# Patient Record
Sex: Male | Born: 2000 | Hispanic: No | Marital: Single | Smoking: Current every day smoker
Health system: Southern US, Community
[De-identification: ages and names within clinical notes are randomized; demographics above are authoritative.]

## PROBLEM LIST (undated history)

## (undated) DIAGNOSIS — Z720 Tobacco use: Secondary | ICD-10-CM

---
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
## 2021-05-25 ENCOUNTER — Encounter (HOSPITAL_COMMUNITY): Payer: Self-pay | Admitting: Emergency Medicine

## 2021-05-25 ENCOUNTER — Other Ambulatory Visit: Payer: Self-pay

## 2021-05-25 ENCOUNTER — Ambulatory Visit (HOSPITAL_COMMUNITY)
Admission: EM | Admit: 2021-05-25 | Discharge: 2021-05-25 | Disposition: A | Discharge status: Home / Self Care | Payer: Medicaid Other | Attending: Emergency Medicine | Admitting: Emergency Medicine

## 2021-05-25 DIAGNOSIS — H1031 Unspecified acute conjunctivitis, right eye: Secondary | ICD-10-CM | POA: Diagnosis not present

## 2021-05-25 MED ORDER — ERYTHROMYCIN 5 MG/GM OP OINT: TOPICAL_OINTMENT | OPHTHALMIC | 0 refills | Status: DC

## 2021-05-25 NOTE — Discharge Instructions (Addendum)
Apply the erythromycin ointment 1/2 inch to the lower eyelid of right eye 4 times a day for the next 5-7 days.  If your symptoms have completely resolved after 5 days you can stop the antibiotics, otherwise continue through 7 days.   Wash pillow cases, wash hands regularly with soap and water, avoid touching your face and eyes, wash door handles, light switches, remotes and other objects you frequently touch Return or follow up with PCP if symptoms persists such as fever, chills, redness, swelling, eye pain, painful eye movements, vision changes.

## 2021-05-25 NOTE — ED Triage Notes (Signed)
Pt c/o right eye pinkish and drainage and was crusted over this morning.

## 2021-05-25 NOTE — ED Provider Notes (Signed)
[facility]    CSN: [id number] Arrival date & time: 05/25/21  0946      History   Chief Complaint Chief Complaint  Patient presents with   Conjunctivitis    HPI [facility] is a 20 y.o. male.   Patient here for evaluation of right eye redness and drainage that started this morning.  Reports right eye was crusted shut this morning.  Denies getting anything into his eyes or feeling like there is something in his eye.  Denies any blurry vision, vision changes, or photophobia.  Patient does not wear contacts or glasses.  Has not tried any OTC medications or treatments.  Denies any trauma, injury, or other precipitating event.  Denies any specific alleviating or aggravating factors.  Denies any fevers, chest pain, shortness of breath, N/V/D, numbness, tingling, weakness, abdominal pain, or headaches.     The history is provided by the patient.  Conjunctivitis   History reviewed. No pertinent past medical history.  There are no problems to display for this patient.   History reviewed. No pertinent surgical history.     Home Medications    Prior to Admission medications   Medication Sig Start Date End Date Taking? Authorizing Provider  erythromycin ophthalmic ointment Place a 1/2 inch ribbon of ointment into the lower eyelid. 05/25/21  Yes Ivette Loyal, NP  cetirizine (ZYRTEC) 10 MG chewable tablet Chew 1 tablet (10 mg total) by mouth daily. 01/08/12 01/07/13  Moreno-Coll, Adlih, MD  diclofenac (CATAFLAM) 50 MG tablet Take 1 tablet (50 mg total) by mouth 2 (two) times daily. For chest pains 12/25/15   Linna Hoff, MD  ibuprofen (ADVIL) 600 MG tablet Take 1 tablet (600 mg total) by mouth every 6 (six) hours as needed. 06/26/20   Dartha Lodge, PA-C  methocarbamol (ROBAXIN) 500 MG tablet Take 1 tablet (500 mg total) by mouth 2 (two) times daily. 06/26/20   Dartha Lodge, PA-C  mupirocin cream (BACTROBAN) 2 % Apply 1 application topically 2 (two) times  daily. 03/27/19   Mickie Bail, NP    Family History Family History  Problem Relation Age of Onset   Healthy Mother    Healthy Father     Social History Social History   Tobacco Use   Smoking status: Every Day    Types: E-cigarettes   Smokeless tobacco: Never  Vaping Use   Vaping Use: Some days   Substances: Nicotine, Flavoring  Substance Use Topics   Alcohol use: No   Drug use: No     Allergies   Patient has no known allergies.   Review of Systems Review of Systems  Eyes:  Positive for discharge and redness. Negative for photophobia and visual disturbance.  All other systems reviewed and are negative.   Physical Exam Triage Vital Signs ED Triage Vitals  Enc Vitals Group     BP 05/25/21 1114 108/73     Pulse Rate 05/25/21 1114 77     Resp 05/25/21 1114 16     Temp 05/25/21 1114 98.9 F (37.2 C)     Temp Source 05/25/21 1114 Oral     SpO2 05/25/21 1114 97 %     Weight --      Height --      Head Circumference --      Peak Flow --      Pain Score 05/25/21 1113 0     Pain Loc --      Pain Edu? --  Excl. in GC? --    No data found.  Updated Vital Signs BP 108/73 (BP Location: Right Arm)   Pulse 77   Temp 98.9 F (37.2 C) (Oral)   Resp 16   SpO2 97%   Visual Acuity Right Eye Distance:   Left Eye Distance:   Bilateral Distance:    Right Eye Near:   Left Eye Near:    Bilateral Near:     Physical Exam Vitals and nursing note reviewed.  Constitutional:      General: He is not in acute distress.    Appearance: Normal appearance. He is not ill-appearing, toxic-appearing or diaphoretic.  HENT:     Head: Normocephalic and atraumatic.  Eyes:     General: Lids are normal. Lids are everted, no foreign bodies appreciated. Vision grossly intact. Gaze aligned appropriately.        Right eye: Discharge present.     Conjunctiva/sclera:     Right eye: Right conjunctiva is injected.     Pupils: Pupils are equal, round, and reactive to light.   Cardiovascular:     Rate and Rhythm: Normal rate.     Pulses: Normal pulses.  Pulmonary:     Effort: Pulmonary effort is normal.  Abdominal:     General: Abdomen is flat.  Musculoskeletal:        General: Normal range of motion.     Cervical back: Normal range of motion.  Skin:    General: Skin is warm and dry.  Neurological:     General: No focal deficit present.     Mental Status: He is alert and oriented to person, place, and time.  Psychiatric:        Mood and Affect: Mood normal.     UC Treatments / Results  Labs (all labs ordered are listed, but only abnormal results are displayed) Labs Reviewed - No data to display  EKG   Radiology No results found.  Procedures Procedures (including critical care time)  Medications Ordered in UC Medications - No data to display  Initial Impression / Assessment and Plan / UC Course  I have reviewed the triage vital signs and the nursing notes.  Pertinent labs & imaging results that were available during my care of the patient were reviewed by me and considered in my medical decision making (see chart for details).    Assessment negative for red flags or concerns.  Likely bacterial conjunctivitis of the right eye Erythromycin ointment as prescribed Wash pillow cases, wash hands regularly with soap and water, avoid touching your face and eyes, wash door handles, light switches, remotes and other objects you frequently touch Return or follow up with PCP if symptoms persists such as fever, chills, redness, swelling, eye pain, painful eye movements, vision changes, etc...  Reviewed expectations re: course of current medical issues. Questions answered. Outlined signs and symptoms indicating need for more acute intervention. Patient verbalized understanding. After Visit Summary given. Final Clinical Impressions(s) / UC Diagnoses   Final diagnoses:  Acute bacterial conjunctivitis of right eye     Discharge Instructions       Apply the erythromycin ointment 1/2 inch to the lower eyelid of right eye 4 times a day for the next 5-7 days.  If your symptoms have completely resolved after 5 days you can stop the antibiotics, otherwise continue through 7 days.   Wash pillow cases, wash hands regularly with soap and water, avoid touching your face and eyes, wash door handles, light switches, remotes  and other objects you frequently touch Return or follow up with PCP if symptoms persists such as fever, chills, redness, swelling, eye pain, painful eye movements, vision changes.      ED Prescriptions     Medication Sig Dispense Auth. Provider   erythromycin ophthalmic ointment Place a 1/2 inch ribbon of ointment into the lower eyelid. 3.5 g Ivette Loyal, NP      PDMP not reviewed this encounter.   Ivette Loyal, NP 05/25/21 [phone]
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
## 2022-06-13 ENCOUNTER — Encounter (HOSPITAL_COMMUNITY): Payer: Self-pay

## 2022-06-13 ENCOUNTER — Ambulatory Visit (HOSPITAL_COMMUNITY)
Admission: EM | Admit: 2022-06-13 | Discharge: 2022-06-13 | Disposition: A | Discharge status: Home / Self Care | Payer: Medicaid Other | Attending: Emergency Medicine | Admitting: Emergency Medicine

## 2022-06-13 ENCOUNTER — Ambulatory Visit (INDEPENDENT_AMBULATORY_CARE_PROVIDER_SITE_OTHER): Payer: Medicaid Other

## 2022-06-13 DIAGNOSIS — M25571 Pain in right ankle and joints of right foot: Secondary | ICD-10-CM

## 2022-06-13 DIAGNOSIS — S93411A Sprain of calcaneofibular ligament of right ankle, initial encounter: Secondary | ICD-10-CM | POA: Diagnosis not present

## 2022-06-13 DIAGNOSIS — M79671 Pain in right foot: Secondary | ICD-10-CM | POA: Diagnosis not present

## 2022-06-13 MED ORDER — IBUPROFEN 800 MG PO TABS
800.0000 mg | ORAL_TABLET | Freq: Once | ORAL | Status: AC
  Administered 2022-06-13: 800 mg via ORAL

## 2022-06-13 MED ORDER — IBUPROFEN 800 MG PO TABS
ORAL_TABLET | ORAL | Status: AC
  Filled 2022-06-13: qty 1

## 2022-06-13 MED ORDER — IBUPROFEN 800 MG PO TABS: 800.0000 mg | ORAL_TABLET | Freq: Three times a day (TID) | ORAL | 0 refills | Status: DC | PRN

## 2022-06-13 NOTE — Discharge Instructions (Addendum)
Please keep your ankle wrapped at all times for the next 2 weeks.  Please also use your crutches whenever you need to do any walking so that you do not walk on your right foot at all.  When you are seated, make sure your foot is always elevated.  Whenever you are able, please apply an ice pack to your ankle, you do not need to remove your Ace wrap to do this.  In 7 to 10 days, if you feel like your swelling has not improved or your pain is getting worse, please consider going to see an orthopedic specialist for further evaluation.  You are welcome to take ibuprofen 800 mg every 6-8 hours as needed for pain, I have sent a prescription to your pharmacy.  Thank you for visiting urgent care today.

## 2022-06-13 NOTE — ED Provider Notes (Signed)
[facility]    CSN: [id number] Arrival date & time: 06/13/22  1810    HISTORY   Chief Complaint  Patient presents with   Ankle Injury   HPI Scott Gardner is a pleasant, 21 y.o. male who presents to urgent care today. Patient states that 2 hours ago, while playing basketball, he landed awkwardly on his right ankle.  Patient states when landing he heard a lot of pops and snaps and had acute onset of pain which radiated up his entire right leg.  Patient states he currently has significant swelling of his ankle and has fractured his right ankle in the past.  EMR reviewed, patient injured his right lower leg in 2019, x-ray of his right tibia and fibula did not reveal any acute fracture.  The history is provided by the patient.   History reviewed. No pertinent past medical history. There are no problems to display for this patient.  History reviewed. No pertinent surgical history.  Home Medications    Prior to Admission medications   Medication Sig Start Date End Date Taking? Authorizing Provider    Family History Family History  Problem Relation Age of Onset   Healthy Mother    Healthy Father    Social History Social History   Tobacco Use   Smoking status: Every Day    Types: E-cigarettes   Smokeless tobacco: Never  Vaping Use   Vaping Use: Some days   Substances: Nicotine, Flavoring  Substance Use Topics   Alcohol use: No   Drug use: No   Allergies   Patient has no known allergies.  Review of Systems Review of Systems Pertinent findings revealed after performing a 14 point review of systems has been noted in the history of present illness.  Physical Exam Triage Vital Signs ED Triage Vitals  Enc Vitals Group     BP 05/25/21 0827 (!) 147/82     Pulse Rate 05/25/21 0827 72     Resp 05/25/21 0827 18     Temp 05/25/21 0827 98.3 F (36.8 C)     Temp Source 05/25/21 0827 Oral     SpO2 05/25/21 0827 98 %     Weight --      Height --       Head Circumference --      Peak Flow --      Pain Score 05/25/21 0826 5     Pain Loc --      Pain Edu? --      Excl. in GC? --    Updated Vital Signs BP 121/77 (BP Location: Right Arm)   Pulse 93   Temp 98.2 F (36.8 C) (Oral)   Resp 16   SpO2 96%   Physical Exam Vitals and nursing note reviewed.  Constitutional:      General: He is not in acute distress.    Appearance: Normal appearance. He is normal weight. He is not ill-appearing.  HENT:     Head: Normocephalic and atraumatic.  Eyes:     Extraocular Movements: Extraocular movements intact.     Conjunctiva/sclera: Conjunctivae normal.     Pupils: Pupils are equal, round, and reactive to light.  Cardiovascular:     Rate and Rhythm: Normal rate and regular rhythm.  Pulmonary:     Effort: Pulmonary effort is normal.     Breath sounds: Normal breath sounds.  Musculoskeletal:     Cervical back: Normal range of motion and neck supple.     Right ankle:  Swelling present. No deformity, ecchymosis or lacerations. Tenderness present over the lateral malleolus and CF ligament. Decreased range of motion. Anterior drawer test positive. Normal pulse.     Right Achilles Tendon: Normal.     Left ankle: Normal.  Skin:    General: Skin is warm and dry.  Neurological:     General: No focal deficit present.     Mental Status: He is alert and oriented to person, place, and time. Mental status is at baseline.  Psychiatric:        Mood and Affect: Mood normal.        Behavior: Behavior normal.        Thought Content: Thought content normal.        Judgment: Judgment normal.     UC Couse / Diagnostics / Procedures:     Radiology DG Foot Complete Right  Result Date: 06/13/2022 CLINICAL DATA:  Basketball injury.  Pain, swelling EXAM: RIGHT FOOT COMPLETE - 3+ VIEW COMPARISON:  None Available. FINDINGS: There is no evidence of fracture or dislocation. There is no evidence of arthropathy or other focal bone abnormality. Soft tissues are  unremarkable. IMPRESSION: Negative. Electronically Signed   By: Charlett Nose M.D.   On: 06/13/2022 20:18    Procedures Procedures (including critical care time) EKG  Pending results:  Labs Reviewed - No data to display  Medications Ordered in UC: Medications  ibuprofen (ADVIL) tablet 800 mg (800 mg Oral Given 06/13/22 2014)    UC Diagnoses / Final Clinical Impressions(s)   I have reviewed the triage vital signs and the nursing notes.  Pertinent labs & imaging results that were available during my care of the patient were reviewed by me and considered in my medical decision making (see chart for details).    Final diagnoses:  Acute right ankle pain  Sprain of calcaneofibular ligament of right ankle, initial encounter   Patient advised of x-ray findings.  Patient placed in an Ace wrap and provided with crutches to avoid weightbearing for the next 2 weeks.  Patient advised that it can take 6 weeks for the sprain to completely heal so patient is to avoid strenuous activities and limit weightbearing only to walking as needed after 2 weeks of nonweightbearing.  Patient advised to take ibuprofen 400 mg every 6-8 hours as needed for pain.  Patient advised to keep foot elevated at all times for the next week and a half.  Patient advised to ice ankle is much as possible.  Patient advised to follow-up with orthopedics if no better in 7 to 10 days.   ED Prescriptions     Medication Sig Dispense Auth. Provider   ibuprofen (ADVIL) 800 MG tablet Take 1 tablet (800 mg total) by mouth every 8 (eight) hours as needed for up to 21 doses for fever, headache, mild pain or moderate pain. 21 tablet Theadora Rama Scales, PA-C      PDMP not reviewed this encounter.  Discharge Instructions:   Discharge Instructions      Please keep your ankle wrapped at all times for the next 2 weeks.  Please also use your crutches whenever you need to do any walking so that you do not walk on your right foot at  all.  When you are seated, make sure your foot is always elevated.  Whenever you are able, please apply an ice pack to your ankle, you do not need to remove your Ace wrap to do this.  In 7 to 10 days, if  you feel like your swelling has not improved or your pain is getting worse, please consider going to see an orthopedic specialist for further evaluation.  You are welcome to take ibuprofen 800 mg every 6-8 hours as needed for pain, I have sent a prescription to your pharmacy.  Thank you for visiting urgent care today.      Disposition Upon Discharge:  Condition: stable for discharge home Home: take medications as prescribed; routine discharge instructions as discussed; follow up as advised.  Patient presented with an acute illness with associated systemic symptoms and significant discomfort requiring urgent management. In my opinion, this is a condition that a prudent lay person (someone who possesses an average knowledge of health and medicine) may potentially expect to result in complications if not addressed urgently such as respiratory distress, impairment of bodily function or dysfunction of bodily organs.   Routine symptom specific, illness specific and/or disease specific instructions were discussed with the patient and/or caregiver at length.   As such, the patient has been evaluated and assessed, work-up was performed and treatment was provided in alignment with urgent care protocols and evidence based medicine.  Patient/parent/caregiver has been advised that the patient may require follow up for further testing and treatment if the symptoms continue in spite of treatment, as clinically indicated and appropriate.  Patient/parent/caregiver has been advised to report to orthopedic urgent care clinic or return to the [facility] or PCP in 3-5 days if no better; follow-up with orthopedics, PCP or the Emergency Department if new signs and symptoms develop or if the current signs or symptoms  continue to change or worsen for further workup, evaluation and treatment as clinically indicated and appropriate  The patient will follow up with their current PCP if and as advised. If the patient does not currently have a PCP we will have assisted them in obtaining one.   The patient may need specialty follow up if the symptoms continue, in spite of conservative treatment and management, for further workup, evaluation, consultation and treatment as clinically indicated and appropriate.  Patient/parent/caregiver verbalized understanding and agreement of plan as discussed.  All questions were addressed during visit.  Please see discharge instructions below for further details of plan.  This office note has been dictated using Teaching laboratory technician.  Unfortunately, this method of dictation can sometimes lead to typographical or grammatical errors.  I apologize for your inconvenience in advance if this occurs.  Please do not hesitate to reach out to me if clarification is needed.      Theadora Rama Scales, PA-C 06/13/22 2033

## 2022-06-13 NOTE — ED Triage Notes (Signed)
Chief Complaint: Landed on the right ankle while playing basketball 2 hours ago. Heard a lot of pops and snaps. Pain now shooting up the entire leg. Swelling in the ankle as well. H/o of fractures to this ankle.

## 2024-02-23 ENCOUNTER — Ambulatory Visit
Admission: RE | Admit: 2024-02-23 | Discharge: 2024-02-23 | Disposition: A | Discharge status: Home / Self Care | Source: Ambulatory Visit | Attending: Family Medicine | Admitting: Family Medicine

## 2024-02-23 ENCOUNTER — Ambulatory Visit (INDEPENDENT_AMBULATORY_CARE_PROVIDER_SITE_OTHER): Admitting: Radiology

## 2024-02-23 VITALS — BP 123/81 | HR 82 | Temp 97.9°F | Resp 16

## 2024-02-23 DIAGNOSIS — M25561 Pain in right knee: Secondary | ICD-10-CM

## 2024-02-23 DIAGNOSIS — S83401A Sprain of unspecified collateral ligament of right knee, initial encounter: Secondary | ICD-10-CM | POA: Diagnosis not present

## 2024-02-23 DIAGNOSIS — M25461 Effusion, right knee: Secondary | ICD-10-CM

## 2024-02-23 DIAGNOSIS — S8391XA Sprain of unspecified site of right knee, initial encounter: Secondary | ICD-10-CM | POA: Diagnosis not present

## 2024-02-23 MED ORDER — IBUPROFEN 800 MG PO TABS: 800.0000 mg | ORAL_TABLET | Freq: Three times a day (TID) | ORAL | 0 refills | Status: DC | PRN

## 2024-02-23 MED ORDER — IBUPROFEN 800 MG PO TABS
800.0000 mg | ORAL_TABLET | Freq: Once | ORAL | Status: AC
  Administered 2024-02-23: 800 mg via ORAL

## 2024-02-23 NOTE — Discharge Instructions (Addendum)
 You were evaluated today for a knee injury sustained during a football game, with worsening symptoms after a pool incident the following day. X-rays showed no acute fracture or dislocation, but there is joint effusion and soft tissue swelling along the anterior and lateral aspects of the knee. Motrin  800 mg was given in clinic and prescribed to take three times daily as needed for pain and inflammation. A knee sleeve was applied for support. To manage symptoms at home, follow the RICE method: rest the knee, apply ice for 15-20 minutes every few hours, use compression with the sleeve, and elevate the leg to reduce swelling. Avoid high-impact activities or bearing weight on the injured leg until symptoms improve. Monitor for worsening pain, increased swelling, numbness, inability to move the knee, or signs of infection such as warmth, redness, or fever. Follow up with orthopedics. Call to make appointment. Seek emergency care if you experience sudden severe pain, joint instability, or cannot bear weight on the leg.

## 2024-02-23 NOTE — ED Triage Notes (Signed)
 Pt c/o right knee pain after he did a cannon ball into a pool over the weekend and hit bottom of pool with knee. States he was drinking and didn't realize he jumped into 48ft end of pool.  Last took ibuprofen  last night. He is able to bend knee but has difficulty straightening knee.

## 2024-02-23 NOTE — ED Provider Notes (Signed)
 [facility]    CSN: [id number] Arrival date & time: 02/23/24  1533      History   Chief Complaint Chief Complaint  Patient presents with   Knee Injury    Entered by patient    HPI Scott Gardner is a 23 y.o. male.   Discussed the use of AI scribe software for clinical note transcription with the patient, who gave verbal consent to proceed.   Patient presents with right knee pain and swelling following two recent injuries. Patient reports a history of previous knee injury around 2020, including a partially torn ligament and hairline fracture.  The patient states he initially injured his knee on Saturday while playing football with friends when he slipped and pulled his knee. The following day, he re-injured the same knee while at a pool. He reports jumping into what he thought was the 6-foot side but was actually the 3-foot side, exacerbating the injury. The patient describes the pain as severe, rating it a 10 out of 10 on a pain scale. He reports experiencing a little numbness in the affected area. He applied ice and heat yesterday but have not attempted any treatments today.   The following portions of the patient's history were reviewed and updated as appropriate: allergies, current medications, past family history, past medical history, past social history, past surgical history, and problem list.     History reviewed. No pertinent past medical history.  There are no active problems to display for this patient.   History reviewed. No pertinent surgical history.     Home Medications    Prior to Admission medications   Medication Sig Start Date End Date Taking? Authorizing Provider  ibuprofen  (ADVIL ) 800 MG tablet Take 1 tablet (800 mg total) by mouth every 8 (eight) hours as needed (pain). Take with food to avoid stomach upset. Do not take any additional NSAIDs while on this. You may take tylenol  in addition to this if needed for extra pain relief.  02/23/24  Yes Iola Lukes, FNP    Family History Family History  Problem Relation Age of Onset   Healthy Mother    Healthy Father     Social History Social History   Tobacco Use   Smoking status: Every Day    Types: E-cigarettes   Smokeless tobacco: Never  Vaping Use   Vaping status: Some Days   Substances: Nicotine, Flavoring  Substance Use Topics   Alcohol use: No   Drug use: No     Allergies   Patient has no known allergies.   Review of Systems Review of Systems  Musculoskeletal:  Positive for joint swelling. Negative for gait problem.  Neurological:  Positive for numbness. Negative for weakness.  All other systems reviewed and are negative.    Physical Exam Triage Vital Signs ED Triage Vitals  Encounter Vitals Group     BP 02/23/24 1539 123/81     Girls Systolic BP Percentile --      Girls Diastolic BP Percentile --      Boys Systolic BP Percentile --      Boys Diastolic BP Percentile --      Pulse Rate 02/23/24 1539 82     Resp 02/23/24 1539 16     Temp 02/23/24 1539 97.9 F (36.6 C)     Temp Source 02/23/24 1539 Oral     SpO2 02/23/24 1539 95 %     Weight --      Height --  Head Circumference --      Peak Flow --      Pain Score 02/23/24 1542 10     Pain Loc --      Pain Education --      Exclude from Growth Chart --    No data found.  Updated Vital Signs BP 123/81 (BP Location: Right Arm)   Pulse 82   Temp 97.9 F (36.6 C) (Oral)   Resp 16   SpO2 95%   Visual Acuity Right Eye Distance:   Left Eye Distance:   Bilateral Distance:    Right Eye Near:   Left Eye Near:    Bilateral Near:     Physical Exam Vitals reviewed.  Constitutional:      General: He is awake. He is not in acute distress.    Appearance: Normal appearance. He is well-developed. He is not ill-appearing, toxic-appearing or diaphoretic.  HENT:     Head: Normocephalic.     Right Ear: Hearing normal.     Left Ear: Hearing normal.     Nose: Nose normal.      Mouth/Throat:     Mouth: Mucous membranes are moist.  Eyes:     General: Vision grossly intact.     Conjunctiva/sclera: Conjunctivae normal.  Cardiovascular:     Rate and Rhythm: Normal rate and regular rhythm.     Heart sounds: Normal heart sounds.  Pulmonary:     Effort: Pulmonary effort is normal.     Breath sounds: Normal breath sounds and air entry.  Musculoskeletal:        General: Normal range of motion.     Cervical back: Full passive range of motion without pain, normal range of motion and neck supple.     Right knee: Swelling present. No deformity, effusion, erythema, ecchymosis, lacerations or crepitus. Normal range of motion. Tenderness present over the LCL and ACL. Normal alignment, normal meniscus and normal patellar mobility.  Skin:    General: Skin is warm and dry.  Neurological:     General: No focal deficit present.     Mental Status: He is alert and oriented to person, place, and time.  Psychiatric:        Speech: Speech normal.        Behavior: Behavior is cooperative.      UC Treatments / Results  Labs (all labs ordered are listed, but only abnormal results are displayed) Labs Reviewed - No data to display  EKG   Radiology DG Knee Complete 4 Views Right Result Date: 02/23/2024 CLINICAL DATA:  Injury skip EXAM: RIGHT KNEE - COMPLETE 4+ VIEW COMPARISON:  None Available. FINDINGS: No acute fracture or dislocation identified. Joint effusion present. No evidence of arthropathy or other focal bone abnormality. There is anterior and lateral knee soft tissue swelling. There is also soft tissue thickening in the region of the patellar tendon. IMPRESSION: 1. No acute fracture or dislocation. 2. Joint effusion. 3. Anterior and lateral knee soft tissue swelling. Electronically Signed   By: Greig Pique M.D.   On: 02/23/2024 16:34    Procedures Procedures (including critical care time)  Medications Ordered in UC Medications  ibuprofen  (ADVIL ) tablet 800 mg  (800 mg Oral Given 02/23/24 1607)    Initial Impression / Assessment and Plan / UC Course  I have reviewed the triage vital signs and the nursing notes.  Pertinent labs & imaging results that were available during my care of the patient were reviewed by me and considered in  my medical decision making (see chart for details).    Patient presents with acute knee pain following an injury sustained during a football game on Saturday, which was worsened by a subsequent pool incident. Reports pain rated 10/10 with some associated numbness. History includes a prior knee injury in 2020 involving a partial ligament tear and hairline fracture. On examination, the knee is swollen but without deformity or obvious effusion. X-ray showed no acute fracture or dislocation but there is a joint effusion in the soft tissues along the anterior and lateral aspects of the right knee.Motrin  800 mg was administered in clinic and prescribed to continue three times daily as needed. A knee sleeve was applied, and the patient was advised to follow RICE (rest, ice, compression, elevation) protocol. Patient will be contacted if any abnormalities are noted on imaging; otherwise, results may be viewed on MyChart. Orthopedic follow-up is recommended if symptoms do not improve within the next few days or if condition worsens.  Today's evaluation has revealed no signs of a dangerous process. Discussed diagnosis with patient and/or guardian. Patient and/or guardian aware of their diagnosis, possible red flag symptoms to watch out for and need for close follow up. Patient and/or guardian understands verbal and written discharge instructions. Patient and/or guardian comfortable with plan and disposition.  Patient and/or guardian has a clear mental status at this time, good insight into illness (after discussion and teaching) and has clear judgment to make decisions regarding their care  Documentation was completed with the aid of voice  recognition software. Transcription may contain typographical errors. Final Clinical Impressions(s) / UC Diagnoses   Final diagnoses:  Acute pain of right knee  Effusion of right knee joint  Sprain of right knee, unspecified ligament, initial encounter     Discharge Instructions      You were evaluated today for a knee injury sustained during a football game, with worsening symptoms after a pool incident the following day. X-rays showed no acute fracture or dislocation, but there is joint effusion and soft tissue swelling along the anterior and lateral aspects of the knee. Motrin  800 mg was given in clinic and prescribed to take three times daily as needed for pain and inflammation. A knee sleeve was applied for support. To manage symptoms at home, follow the RICE method: rest the knee, apply ice for 15-20 minutes every few hours, use compression with the sleeve, and elevate the leg to reduce swelling. Avoid high-impact activities or bearing weight on the injured leg until symptoms improve. Monitor for worsening pain, increased swelling, numbness, inability to move the knee, or signs of infection such as warmth, redness, or fever. Follow up with orthopedics. Call to make appointment. Seek emergency care if you experience sudden severe pain, joint instability, or cannot bear weight on the leg.      ED Prescriptions     Medication Sig Dispense Auth. Provider   ibuprofen  (ADVIL ) 800 MG tablet Take 1 tablet (800 mg total) by mouth every 8 (eight) hours as needed (pain). Take with food to avoid stomach upset. Do not take any additional NSAIDs while on this. You may take tylenol  in addition to this if needed for extra pain relief. 21 tablet Iola Lukes, FNP      PDMP not reviewed this encounter.   Iola Lukes, [place] 02/23/24 [phone]

## 2024-02-25 ENCOUNTER — Ambulatory Visit: Payer: Self-pay

## 2024-02-25 ENCOUNTER — Encounter (HOSPITAL_BASED_OUTPATIENT_CLINIC_OR_DEPARTMENT_OTHER): Payer: Self-pay

## 2024-02-25 ENCOUNTER — Other Ambulatory Visit: Payer: Self-pay

## 2024-02-25 ENCOUNTER — Emergency Department (HOSPITAL_BASED_OUTPATIENT_CLINIC_OR_DEPARTMENT_OTHER): Admitting: Radiology

## 2024-02-25 ENCOUNTER — Emergency Department (HOSPITAL_BASED_OUTPATIENT_CLINIC_OR_DEPARTMENT_OTHER)

## 2024-02-25 ENCOUNTER — Inpatient Hospital Stay (HOSPITAL_BASED_OUTPATIENT_CLINIC_OR_DEPARTMENT_OTHER)
Admission: EM | Admit: 2024-02-25 | Discharge: 2024-02-27 | DRG: 287 | Disposition: A | Discharge status: Home / Self Care | Attending: Internal Medicine | Admitting: Internal Medicine

## 2024-02-25 DIAGNOSIS — R072 Precordial pain: Secondary | ICD-10-CM

## 2024-02-25 DIAGNOSIS — F17290 Nicotine dependence, other tobacco product, uncomplicated: Secondary | ICD-10-CM | POA: Diagnosis present

## 2024-02-25 DIAGNOSIS — I214 Non-ST elevation (NSTEMI) myocardial infarction: Secondary | ICD-10-CM | POA: Diagnosis not present

## 2024-02-25 DIAGNOSIS — E669 Obesity, unspecified: Secondary | ICD-10-CM | POA: Diagnosis present

## 2024-02-25 DIAGNOSIS — R0789 Other chest pain: Secondary | ICD-10-CM | POA: Diagnosis not present

## 2024-02-25 DIAGNOSIS — M25561 Pain in right knee: Secondary | ICD-10-CM | POA: Diagnosis not present

## 2024-02-25 DIAGNOSIS — Z79899 Other long term (current) drug therapy: Secondary | ICD-10-CM

## 2024-02-25 DIAGNOSIS — R7989 Other specified abnormal findings of blood chemistry: Principal | ICD-10-CM | POA: Diagnosis present

## 2024-02-25 DIAGNOSIS — I5A Non-ischemic myocardial injury (non-traumatic): Secondary | ICD-10-CM | POA: Diagnosis present

## 2024-02-25 DIAGNOSIS — R079 Chest pain, unspecified: Secondary | ICD-10-CM | POA: Diagnosis present

## 2024-02-25 DIAGNOSIS — F1090 Alcohol use, unspecified, uncomplicated: Secondary | ICD-10-CM | POA: Insufficient documentation

## 2024-02-25 DIAGNOSIS — Z8249 Family history of ischemic heart disease and other diseases of the circulatory system: Secondary | ICD-10-CM

## 2024-02-25 DIAGNOSIS — Z823 Family history of stroke: Secondary | ICD-10-CM

## 2024-02-25 DIAGNOSIS — Z6834 Body mass index (BMI) 34.0-34.9, adult: Secondary | ICD-10-CM

## 2024-02-25 DIAGNOSIS — I409 Acute myocarditis, unspecified: Principal | ICD-10-CM | POA: Diagnosis present

## 2024-02-25 HISTORY — DX: Tobacco use: Z72.0

## 2024-02-25 LAB — BASIC METABOLIC PANEL WITH GFR
Anion gap: 8 (ref 5–15)
BUN: 9 mg/dL (ref 6–20)
CO2: 28 mmol/L (ref 22–32)
Calcium: 9.5 mg/dL (ref 8.9–10.3)
Chloride: 105 mmol/L (ref 98–111)
Creatinine, Ser: 1.13 mg/dL (ref 0.61–1.24)
GFR, Estimated: >60 mL/min (ref >60)
Glucose, Bld: 119 mg/dL — ABNORMAL HIGH (ref 70–99)
Potassium: 4.3 mmol/L (ref 3.5–5.1)
Sodium: 141 mmol/L (ref 135–145)

## 2024-02-25 LAB — HIV ANTIBODY (ROUTINE TESTING W REFLEX): HIV Screen 4th Generation wRfx: NONREACTIVE

## 2024-02-25 LAB — PROTIME-INR
INR: 1 (ref 0.8–1.2)
Prothrombin Time: 14 s (ref 11.4–15.2)

## 2024-02-25 LAB — RAPID URINE DRUG SCREEN, HOSP PERFORMED
Amphetamines: NOT DETECTED
Barbiturates: NOT DETECTED
Benzodiazepines: NOT DETECTED
Cocaine: NOT DETECTED
Opiates: NOT DETECTED
Tetrahydrocannabinol: POSITIVE — AB

## 2024-02-25 LAB — TROPONIN T, HIGH SENSITIVITY
Troponin T High Sensitivity: 263 ng/L (ref <19)
Troponin T High Sensitivity: 385 ng/L (ref <19)

## 2024-02-25 LAB — CBC
HCT: 47.4 % (ref 39.0–52.0)
Hemoglobin: 16 g/dL (ref 13.0–17.0)
MCH: 30.5 pg (ref 26.0–34.0)
MCHC: 33.8 g/dL (ref 30.0–36.0)
MCV: 90.3 fL (ref 80.0–100.0)
Platelets: 175 K/uL (ref 150–400)
RBC: 5.25 MIL/uL (ref 4.22–5.81)
RDW: 12.5 % (ref 11.5–15.5)
WBC: 7.7 K/uL (ref 4.0–10.5)
nRBC: 0 % (ref 0.0–0.2)

## 2024-02-25 LAB — SEDIMENTATION RATE: Sed Rate: 1 mm/h (ref 0–16)

## 2024-02-25 LAB — TROPONIN I (HIGH SENSITIVITY): Troponin I (High Sensitivity): 8509 ng/L (ref <18)

## 2024-02-25 LAB — TSH: TSH: 1.427 u[IU]/mL (ref 0.350–4.500)

## 2024-02-25 LAB — APTT: aPTT: 31 s (ref 24–36)

## 2024-02-25 LAB — ETHANOL: Alcohol, Ethyl (B): <15 mg/dL (ref <15)

## 2024-02-25 LAB — C-REACTIVE PROTEIN: CRP: 1.6 mg/dL — ABNORMAL HIGH (ref <1.0)

## 2024-02-25 MED ORDER — ONDANSETRON HCL 4 MG PO TABS: 4.0000 mg | ORAL_TABLET | Freq: Four times a day (QID) | ORAL | Status: DC | PRN

## 2024-02-25 MED ORDER — HEPARIN (PORCINE) 25000 UT/250ML-% IV SOLN
1450.0000 [IU]/h | INTRAVENOUS | Status: DC
  Administered 2024-02-25: 1200 [IU]/h via INTRAVENOUS

## 2024-02-25 MED ORDER — HEPARIN (PORCINE) 25000 UT/250ML-% IV SOLN
1400.0000 [IU]/h | INTRAVENOUS | Status: DC
  Filled 2024-02-25: qty 250

## 2024-02-25 MED ORDER — IOHEXOL 350 MG/ML SOLN
100.0000 mL | Freq: Once | INTRAVENOUS | Status: AC | PRN
  Administered 2024-02-25: 100 mL via INTRAVENOUS

## 2024-02-25 MED ORDER — FOLIC ACID 1 MG PO TABS
1.0000 mg | ORAL_TABLET | Freq: Every day | ORAL | Status: DC
  Administered 2024-02-25 – 2024-02-27 (×3): 1 mg via ORAL
  Filled 2024-02-25 (×3): qty 1

## 2024-02-25 MED ORDER — ASPIRIN 81 MG PO CHEW
81.0000 mg | CHEWABLE_TABLET | Freq: Every day | ORAL | Status: DC
  Administered 2024-02-25: 81 mg via ORAL
  Filled 2024-02-25: qty 1

## 2024-02-25 MED ORDER — ADULT MULTIVITAMIN W/MINERALS CH
1.0000 | ORAL_TABLET | Freq: Every day | ORAL | Status: DC
  Administered 2024-02-25 – 2024-02-27 (×3): 1 via ORAL
  Filled 2024-02-25 (×3): qty 1

## 2024-02-25 MED ORDER — SODIUM CHLORIDE 0.9 % IV SOLN: 250.0000 mL | INTRAVENOUS | Status: AC | PRN

## 2024-02-25 MED ORDER — ACETAMINOPHEN 650 MG RE SUPP: 650.0000 mg | Freq: Four times a day (QID) | RECTAL | Status: DC | PRN

## 2024-02-25 MED ORDER — ASPIRIN 81 MG PO TBEC
81.0000 mg | DELAYED_RELEASE_TABLET | Freq: Every day | ORAL | Status: DC
  Administered 2024-02-26: 81 mg via ORAL
  Filled 2024-02-25: qty 1

## 2024-02-25 MED ORDER — HEPARIN BOLUS VIA INFUSION
4000.0000 [IU] | Freq: Once | INTRAVENOUS | Status: DC
  Filled 2024-02-25: qty 4000

## 2024-02-25 MED ORDER — HEPARIN SODIUM (PORCINE) 5000 UNIT/ML IJ SOLN: 5000.0000 [IU] | Freq: Three times a day (TID) | INTRAMUSCULAR | Status: DC

## 2024-02-25 MED ORDER — SODIUM CHLORIDE 0.9% FLUSH
3.0000 mL | Freq: Two times a day (BID) | INTRAVENOUS | Status: DC
  Administered 2024-02-25 – 2024-02-27 (×3): 3 mL via INTRAVENOUS

## 2024-02-25 MED ORDER — SODIUM CHLORIDE 0.9% FLUSH
3.0000 mL | Freq: Two times a day (BID) | INTRAVENOUS | Status: DC
  Administered 2024-02-26 – 2024-02-27 (×2): 3 mL via INTRAVENOUS

## 2024-02-25 MED ORDER — ASPIRIN 325 MG PO TABS
325.0000 mg | ORAL_TABLET | Freq: Every day | ORAL | Status: DC
  Filled 2024-02-25: qty 1

## 2024-02-25 MED ORDER — ASPIRIN 81 MG PO CHEW
243.0000 mg | CHEWABLE_TABLET | Freq: Once | ORAL | Status: AC
  Administered 2024-02-25: 243 mg via ORAL
  Filled 2024-02-25: qty 3

## 2024-02-25 MED ORDER — SODIUM CHLORIDE 0.9% FLUSH: 3.0000 mL | INTRAVENOUS | Status: DC | PRN

## 2024-02-25 MED ORDER — HEPARIN BOLUS VIA INFUSION
4000.0000 [IU] | Freq: Once | INTRAVENOUS | Status: AC
  Administered 2024-02-25: 4000 [IU] via INTRAVENOUS
  Filled 2024-02-25: qty 4000

## 2024-02-25 MED ORDER — ONDANSETRON HCL 4 MG/2ML IJ SOLN: 4.0000 mg | Freq: Four times a day (QID) | INTRAMUSCULAR | Status: DC | PRN

## 2024-02-25 MED ORDER — THIAMINE MONONITRATE 100 MG PO TABS
100.0000 mg | ORAL_TABLET | Freq: Every day | ORAL | Status: DC
  Administered 2024-02-25 – 2024-02-27 (×3): 100 mg via ORAL
  Filled 2024-02-25 (×3): qty 1

## 2024-02-25 MED ORDER — ACETAMINOPHEN 325 MG PO TABS: 650.0000 mg | ORAL_TABLET | Freq: Four times a day (QID) | ORAL | Status: DC | PRN

## 2024-02-25 MED ORDER — PANTOPRAZOLE SODIUM 40 MG PO TBEC
40.0000 mg | DELAYED_RELEASE_TABLET | Freq: Every day | ORAL | Status: DC
  Administered 2024-02-25: 40 mg via ORAL
  Filled 2024-02-25: qty 1

## 2024-02-25 NOTE — ED Notes (Signed)
 I have just called report to Suzen, Charity fundraiser at [facility], [place] [address] [facility] has just arrived for tx.

## 2024-02-25 NOTE — H&P (Addendum)
 History and Physical    Scott Gardner FMW:[id number] DOB: September 05, 2000 DOA: 02/25/2024  PCP: Pcp, No   Patient coming from: Home   Chief Complaint:  Chief Complaint  Patient presents with   Chest Pain   ED TRIAGE note:  HPI:  Scott Gardner is a 23 y.o. male with no significant past medical history except recent right-sided knee injury recently being seen in urgent care 2 days ago recommended RICE and conservative management presented to emergency department complaining of chest pain.  Patient reported that he has 1 hour episode of chest pain on Monday he was at home and was not doing anything exertional.  Stated that starting 1 set up mid substernal chest pain which is sharp and stabbing quality radiation to the left-sided back with associated dizziness.  Chest pain lasted for 1 hour and after taking rest it completely resolves.  He woke up from the sleep with sweating.  Denies any previous episodes of chest pain like this and no significant past cardiac history.  Reported her father side history of sudden cardiac death.  Currently patient does not have any chest pain.  He denies any shortness of breath, palpitation, nausea, fever, and chills.  Patient reported that he has been drinking heavily over the weekend approximate drinks 20 shots of liquor every day on Friday and Saturday and normally he does 6 shots of liquor weekly.  Denies any drug use.  Patient denies any recent URI, UTI, nausea vomiting diarrhea.  Denies any fever, chill, cough, congestion, abdominal pain, dysuria, lower extremity swelling and rash.  No previous history of chest pain like this episodes before and no significant cardiac history.  Other workup in the ED showed nonspecific EKG change and ED patient consulted cardiology Dr. [place] who recommended trend troponin, obtain echocardiogram and admit patient for observation at this time.  Patient has been transferred to [facility] for further  evaluation and management of chest pain and chronic alcohol use.  At presentation to ED patient found hypertensive however after all blood pressure has been improved. Initial troponin 263 trended up to 385. CBC unremarkable.  BMP unremarkable.  Blood alcohol level less than 15. Pending UDS, ESR CRP.  Pending TSH.  EKG shows sinus bradycardia heart rate 57, ST elevation in lead II considering early repolarization pericarditis and a nonspecific ST-T wave abnormality.  Normal PR interval and QRS interval.  Chest x-ray unremarkable.  CTA chest/out of no evidence of thoracic aneurysm, dissection or vasculitis.  No evidence of PE.  Normal findings.  Hospitalist has been consulted for management of chest pain.  Significant labs in the ED: Lab Orders         Basic metabolic panel         CBC         Ethanol         Rapid urine drug screen (hospital performed)         Sedimentation rate         C-reactive protein         Comprehensive metabolic panel         CBC         Protime-INR         APTT         Lipid panel         HIV Antibody (routine testing w rflx)         TSH       Review of Systems:  Review of Systems  Constitutional:  Negative for chills, fever, malaise/fatigue and weight loss.  Respiratory:  Negative for cough, sputum production and shortness of breath.   Cardiovascular:  Negative for chest pain, palpitations, orthopnea, claudication, leg swelling and PND.  Gastrointestinal:  Negative for abdominal pain, constipation, diarrhea, heartburn, nausea and vomiting.  Genitourinary:  Negative for dysuria, frequency and urgency.  Musculoskeletal:  Negative for back pain, falls, joint pain, myalgias and neck pain.  Neurological:  Negative for dizziness and headaches.  Psychiatric/Behavioral:  The patient is not nervous/anxious.     Past Medical History:  Diagnosis Date   Tobacco use     History reviewed. No pertinent surgical history.   reports that he has been smoking  e-cigarettes. He has never used smokeless tobacco. He reports current alcohol use. He reports that he does not use drugs.  No Known Allergies  Family History  Problem Relation Age of Onset   Healthy Mother    Stroke Father     Prior to Admission medications   Medication Sig Start Date End Date Taking? Authorizing Provider  ibuprofen  (ADVIL ) 800 MG tablet Take 1 tablet (800 mg total) by mouth every 8 (eight) hours as needed (pain). Take with food to avoid stomach upset. Do not take any additional NSAIDs while on this. You may take tylenol  in addition to this if needed for extra pain relief. 02/23/24   Iola Lukes, FNP     Physical Exam: Vitals:   02/25/24 1647 02/25/24 1715 02/25/24 1943 02/25/24 1954  BP:  119/73 131/74 118/65  Pulse:  63 72 71  Resp:  19 18   Temp: 98.7 F (37.1 C)  98.5 F (36.9 C) 97.6 F (36.4 C)  TempSrc: Oral  Oral   SpO2:  100% 99% 99%  Weight:   109 kg   Height:        Physical Exam Vitals and nursing note reviewed.  Eyes:     Pupils: Pupils are equal, round, and reactive to light.  Cardiovascular:     Rate and Rhythm: Normal rate and regular rhythm.     Heart sounds: Normal heart sounds.  Pulmonary:     Breath sounds: Normal breath sounds.  Musculoskeletal:     Cervical back: Normal range of motion and neck supple.     Right lower leg: No tenderness. No edema.     Left lower leg: No tenderness. No edema.  Skin:    General: Skin is warm.     Capillary Refill: Capillary refill takes less than 2 seconds.  Neurological:     Mental Status: He is alert.  Psychiatric:        Mood and Affect: Mood normal.      Labs on Admission: I have personally reviewed following labs and imaging studies  CBC: Recent Labs  Lab 02/25/24 1144  WBC 7.7  HGB 16.0  HCT 47.4  MCV 90.3  PLT 175   Basic Metabolic Panel: Recent Labs  Lab 02/25/24 1144  NA 141  K 4.3  CL 105  CO2 28  GLUCOSE 119*  BUN 9  CREATININE 1.13  CALCIUM  9.5    GFR: Estimated Creatinine Clearance: 126.8 mL/min (by C-G formula based on SCr of 1.13 mg/dL). Liver Function Tests: No results for input(s): AST, ALT, ALKPHOS, BILITOT, PROT, ALBUMIN in the last 168 hours. No results for input(s): LIPASE, AMYLASE in the last 168 hours. No results for input(s): AMMONIA in the last 168 hours. Coagulation Profile: No results for input(s): INR, PROTIME in the  last 168 hours. Cardiac Enzymes: No results for input(s): CKTOTAL, CKMB, CKMBINDEX, TROPONINI, TROPONINIHS in the last 168 hours. BNP (last 3 results) No results for input(s): BNP in the last 8760 hours. HbA1C: No results for input(s): HGBA1C in the last 72 hours. CBG: No results for input(s): GLUCAP in the last 168 hours. Lipid Profile: No results for input(s): CHOL, HDL, LDLCALC, TRIG, CHOLHDL, LDLDIRECT in the last 72 hours. Thyroid Function Tests: No results for input(s): TSH, T4TOTAL, FREET4, T3FREE, THYROIDAB in the last 72 hours. Anemia Panel: No results for input(s): VITAMINB12, FOLATE, FERRITIN, TIBC, IRON, RETICCTPCT in the last 72 hours. Urine analysis: No results found for: COLORURINE, APPEARANCEUR, LABSPEC, PHURINE, GLUCOSEU, HGBUR, BILIRUBINUR, KETONESUR, PROTEINUR, UROBILINOGEN, NITRITE, LEUKOCYTESUR  Radiological Exams on Admission: I have personally reviewed images CT Angio Chest Aorta W and/or Wo Contrast Result Date: 02/25/2024 CLINICAL DATA:  Acute aortic syndrome (AAS) suspected. Chest pain. Constant. EXAM: CT ANGIOGRAPHY CHEST WITH CONTRAST TECHNIQUE: Multidetector CT imaging of the chest was performed using the standard protocol during bolus administration of intravenous contrast. Multiplanar CT image reconstructions and MIPs were obtained to evaluate the vascular anatomy. RADIATION DOSE REDUCTION: This exam was performed according to the departmental dose-optimization program which  includes automated exposure control, adjustment of the mA and/or kV according to patient size and/or use of iterative reconstruction technique. CONTRAST:  OMNIPAQUE  IOHEXOL  350 MG/ML SOLN COMPARISON:  None Available. FINDINGS: Cardiovascular: Evaluation is limited due to patient's motion during data acquisition. There is significant motion especially at the level of aortic arch/main pulmonary trunk region. Preferential opacification of the thoracic aorta. No evidence of thoracic aortic aneurysm, dissection, penetrating atherosclerotic ulcer or vasculitis. Normal heart size. No pericardial effusion. There is also satisfactory opacification of pulmonary arteries. No evidence of embolism to the proximal subsegmental pulmonary artery level. No aortic aneurysm. Mediastinum/Nodes: Visualized thyroid gland appears grossly unremarkable. No solid / cystic mediastinal masses. The esophagus is nondistended precluding optimal assessment. No axillary, mediastinal or hilar lymphadenopathy by size criteria. Lungs/Pleura: The central tracheo-bronchial tree is patent. No mass or consolidation. No pleural effusion or pneumothorax. No suspicious lung nodules. Upper Abdomen: There is an approximately 1.3 x 1.5 cm simple cyst in the left kidney upper pole, posteriorly. Remaining visualized upper abdominal viscera within normal limits. Musculoskeletal: The visualized soft tissues of the chest wall are grossly unremarkable. No suspicious osseous lesions. Review of the MIP images confirms the above findings. IMPRESSION: 1. Evaluation is limited due to patient's motion during data acquisition. No evidence of thoracic aortic aneurysm, dissection or vasculitis. No evidence of pulmonary embolism to the proximal subsegmental pulmonary artery level. 2. No lung mass, consolidation, pleural effusion or pneumothorax. 3. Essentially unremarkable exam, as described above. Electronically Signed   By: Ree Molt M.D.   On: 02/25/2024 14:37    DG Chest 2 View Result Date: 02/25/2024 CLINICAL DATA:  Chest pain. EXAM: CHEST - 2 VIEW COMPARISON:  Chest radiograph dated 12/25/2015. FINDINGS: No focal consolidation, pleural effusion, pneumothorax. The cardiac silhouette is within normal limits. No acute osseous pathology. IMPRESSION: No active cardiopulmonary disease. Electronically Signed   By: Vanetta Chou M.D.   On: 02/25/2024 12:11     EKG: My personal interpretation of EKG shows: EKG shows sinus bradycardia heart rate 57, ST elevation in lead II considering early repolarization pericarditis and a nonspecific ST-T wave abnormality.  Normal PR interval and QRS interval.    Assessment/Plan: Principal Problem:   NSTEMI (non-ST elevated myocardial infarction) [facility]) Active Problems:   Chest pain  Chronic alcohol use   Right knee pain    Assessment and Plan: NSTEMI Chest pain -Presented emergency department sudden onset of mid substernal chest pain for 1 hour which has been subsided with rest.  Chest pain is sharp in quality with associated dizziness radiation into the back and throat.  Patient reported chest pain get worse with exertion.  Denies any recent sign of infection.  Presentation to ED patient is chest pain-free.  Denies any previous history of chest pain and cardiac history. - Hemodynamically stable. -Initial troponin 263 trended up to 385. CBC unremarkable.  BMP unremarkable.  Blood alcohol level less than 15. Pending UDS, ESR CRP.  Pending TSH. EKG shows sinus bradycardia heart rate 57, ST elevation in lead II considering early repolarization pericarditis and a nonspecific ST-T wave abnormality.  Normal PR interval and QRS interval. Chest x-ray unremarkable.  CTA chest/out of no evidence of thoracic aneurysm, dissection or vasculitis.  No evidence of PE.  Normal findings. -At this time unclear etiology of chest pain. Cardiology Dr. Cherri at the bedside recommended hold off any heparin  drip unless third  troponin trends are very high.  Recommended to obtain echocardiogram. - Also checking ESR, CRP, UDS and TSH level. -Continue to trend troponin. - Continue cardiac monitoring. -Cardiology recommended keep aspirin  234 mg once followed by continue aspirin  81 mg daily.  Also based on further workup will decide recommendation for other IV antibiotic treatment if concern for pericarditis. Addendum and update - Troponin has been trended up to [phone].  Given there is a significant change of troponin from [phone] range starting heparin  drip. Also informed on-call cardiology again.   Chronic alcohol use -Patient reported drinking heavily over the weekend.  Continue to check CIWA.   Right-sided knee joint pain History of right-sided knee joint injury 2020.  Recently was seen in the ED 2 days ago for right-sided knee joint pain while he has been experienced fall and developed pain following the fall.  X-ray did not show any evidence of fracture or dislocation except joint effusion.  Continue conservative management and RICE.    DVT prophylaxis:  IV heparin  gtts Code Status:  Full Code Diet: Heart healthy diet Family Communication:   Family was present at bedside, at the time of interview. Opportunity was given to ask question and all questions were answered satisfactorily.  Disposition Plan: Continue to trend troponin level, continue heparin  drip.  If echocardiogram shows any wall motion abnormality cardiology will possibly do further intervention. Consults: Cardiology Admission status:   Observation, Telemetry bed  Severity of Illness: The appropriate patient status for this patient is OBSERVATION. Observation status is judged to be reasonable and necessary in order to provide the required intensity of service to ensure the patient's safety. The patient's presenting symptoms, physical exam findings, and initial radiographic and laboratory data in the context of their medical condition is felt  to place them at decreased risk for further clinical deterioration. Furthermore, it is anticipated that the patient will be medically stable for discharge from the hospital within 2 midnights of admission.     Rael Yo, MD [facility]  How to contact the [facility] Attending or Consulting provider 7A - 7P or covering provider during after hours 7P -7A, for this patient.  Check the care team in [facility] and look for a) attending/consulting [facility] provider listed and b) the [facility] team listed Log into [email or web address] and use 's universal password to access. If you do not have the password, please contact  the hospital operator. Locate the [facility] provider you are looking for under [facility] and page to a number that you can be directly reached. If you still have difficulty reaching the provider, please page the [facility] (Director on Call) for the Hospitalists listed on amion for assistance.  02/25/2024, 8:53 PM

## 2024-02-25 NOTE — Plan of Care (Addendum)
 Scott Gardner, is a 23 y.o. male, DOB - 2001-05-29, FMW:[id number]  23 yr old healthy male, presented to [facility] with atypical chest pain, Trop borderline high, EKG non specific changes, per Cards Dr. [place] - 23 hr Obs, echo, cards consult upon arrival, pain free now.   Vitals:   02/25/24 1139 02/25/24 1400  BP: (!) 145/80 117/62  Pulse: (!) 58 64  Resp: 18 (!) 21  Temp: 98.7 F (37.1 C)   SpO2: 98% 100%  Weight: 104.3 kg   Height: 5' 10 (1.778 m)         Data Review   Micro Results No results found for this or any previous visit (from the past 240 hours).  Radiology Reports CT Angio Chest Aorta W and/or Wo Contrast Result Date: 02/25/2024 CLINICAL DATA:  Acute aortic syndrome (AAS) suspected. Chest pain. Constant. EXAM: CT ANGIOGRAPHY CHEST WITH CONTRAST TECHNIQUE: Multidetector CT imaging of the chest was performed using the standard protocol during bolus administration of intravenous contrast. Multiplanar CT image reconstructions and MIPs were obtained to evaluate the vascular anatomy. RADIATION DOSE REDUCTION: This exam was performed according to the departmental dose-optimization program which includes automated exposure control, adjustment of the mA and/or kV according to patient size and/or use of iterative reconstruction technique. CONTRAST:  OMNIPAQUE  IOHEXOL  350 MG/ML SOLN COMPARISON:  None Available. FINDINGS: Cardiovascular: Evaluation is limited due to patient's motion during data acquisition. There is significant motion especially at the level of aortic arch/main pulmonary trunk region. Preferential opacification of the thoracic aorta. No evidence of thoracic aortic aneurysm, dissection, penetrating atherosclerotic ulcer or vasculitis. Normal heart size. No pericardial effusion. There is also satisfactory opacification of pulmonary arteries. No evidence of embolism to the proximal  subsegmental pulmonary artery level. No aortic aneurysm. Mediastinum/Nodes: Visualized thyroid gland appears grossly unremarkable. No solid / cystic mediastinal masses. The esophagus is nondistended precluding optimal assessment. No axillary, mediastinal or hilar lymphadenopathy by size criteria. Lungs/Pleura: The central tracheo-bronchial tree is patent. No mass or consolidation. No pleural effusion or pneumothorax. No suspicious lung nodules. Upper Abdomen: There is an approximately 1.3 x 1.5 cm simple cyst in the left kidney upper pole, posteriorly. Remaining visualized upper abdominal viscera within normal limits. Musculoskeletal: The visualized soft tissues of the chest wall are grossly unremarkable. No suspicious osseous lesions. Review of the MIP images confirms the above findings. IMPRESSION: 1. Evaluation is limited due to patient's motion during data acquisition. No evidence of thoracic aortic aneurysm, dissection or vasculitis. No evidence of pulmonary embolism to the proximal subsegmental pulmonary artery level. 2. No lung mass, consolidation, pleural effusion or pneumothorax. 3. Essentially unremarkable exam, as described above. Electronically Signed   By: Ree Molt M.D.   On: 02/25/2024 14:37   DG Chest 2 View Result Date: 02/25/2024 CLINICAL DATA:  Chest pain. EXAM: CHEST - 2 VIEW COMPARISON:  Chest radiograph dated 12/25/2015. FINDINGS: No focal consolidation, pleural effusion, pneumothorax. The cardiac silhouette is within normal limits. No acute osseous pathology. IMPRESSION: No active cardiopulmonary disease. Electronically Signed   By: Vanetta Chou M.D.   On: 02/25/2024 12:11   DG Knee Complete 4 Views Right Result Date: 02/23/2024 CLINICAL DATA:  Injury skip EXAM: RIGHT KNEE - COMPLETE 4+ VIEW COMPARISON:  None Available. FINDINGS: No acute fracture or dislocation identified. Joint effusion present. No evidence of arthropathy or other focal bone abnormality. There is anterior and  lateral knee soft tissue swelling. There is also soft tissue thickening in the region of the patellar tendon.  IMPRESSION: 1. No acute fracture or dislocation. 2. Joint effusion. 3. Anterior and lateral knee soft tissue swelling. Electronically Signed   By: Greig Pique M.D.   On: 02/23/2024 16:34    CBC Recent Labs  Lab 02/25/24 1144  WBC 7.7  HGB 16.0  HCT 47.4  PLT 175  MCV 90.3  MCH 30.5  MCHC 33.8  RDW 12.5    Chemistries  Recent Labs  Lab 02/25/24 1144  NA 141  K 4.3  CL 105  CO2 28  GLUCOSE 119*  BUN 9  CREATININE 1.13  CALCIUM  9.5   ------------------------------------------------------------------------------------------------------------------ estimated creatinine clearance is 124 mL/min (by C-G formula based on SCr of 1.13 mg/dL). ------------------------------------------------------------------------------------------------------------------ No results for input(s): HGBA1C in the last 72 hours. ------------------------------------------------------------------------------------------------------------------ No results for input(s): CHOL, HDL, LDLCALC, TRIG, CHOLHDL, LDLDIRECT in the last 72 hours. ------------------------------------------------------------------------------------------------------------------ No results for input(s): TSH, T4TOTAL, T3FREE, THYROIDAB in the last 72 hours.  Invalid input(s): FREET3 ------------------------------------------------------------------------------------------------------------------ No results for input(s): VITAMINB12, FOLATE, FERRITIN, TIBC, IRON, RETICCTPCT in the last 72 hours.  Coagulation profile No results for input(s): INR, PROTIME in the last 168 hours.  No results for input(s): DDIMER in the last 72 hours.  Cardiac Enzymes No results for input(s): CKMB, TROPONINI, MYOGLOBIN in the last 168 hours.  Invalid input(s):  CK ------------------------------------------------------------------------------------------------------------------ Invalid input(s): POCBNP   Signature  Lavada Stank M.D on 02/25/2024 at 3:12 PM   -  To page go to [email or web address]

## 2024-02-25 NOTE — ED Provider Notes (Signed)
 [place] EMERGENCY DEPARTMENT AT [facility] Provider Note   CSN: [id number] Arrival date & time: 02/25/24  1130     Patient presents with: Chest Pain   Scott Gardner is a 23 y.o. male.   Chest Pain Patient is a 23 year old male presents the ED today for acute onset chest pain that began 2 days ago, accompanied with dizziness when standing, no chronic medical history at this time.  No family history of early heart disease but does have extensive history of heart disease.  No history of blood clots.  Notes that symptoms are worse with exertion, described as a sharp stabbing pain in the center of my chest, with chest pressure to the left side and going to my back.  Pain is not worse or relieved with positioning. No other complaints at this time.  States that he did have a heavy weekend of drinking.  Stating that he had about approximately 20 shots of liquor each day on Friday and Saturday.  States he normally has about 6 drinks a week, but does not drink on a daily basis.  With drinking mainly consisting of over the weekends. Denies IVD use, illicit drug use.  Denies fever, headache, vision changes, cough, congestion, abdominal pain, nausea, vomiting, diarrhea, dysuria, lower leg swelling rashes.       Prior to Admission medications   Medication Sig Start Date End Date Taking? Authorizing Provider  ibuprofen  (ADVIL ) 800 MG tablet Take 1 tablet (800 mg total) by mouth every 8 (eight) hours as needed (pain). Take with food to avoid stomach upset. Do not take any additional NSAIDs while on this. You may take tylenol  in addition to this if needed for extra pain relief. 02/23/24   Murrill, Samantha, FNP    Allergies: Patient has no known allergies.    Review of Systems  Cardiovascular:  Positive for chest pain.  All other systems reviewed and are negative.   Updated Vital Signs BP 117/62   Pulse 64   Temp 98.7 F (37.1 C)   Resp (!) 21   Ht 5' 10 (1.778 m)    Wt 104.3 kg   SpO2 100%   BMI 33.00 kg/m   Physical Exam Vitals and nursing note reviewed.  Constitutional:      General: He is not in acute distress.    Appearance: Normal appearance. He is not ill-appearing or diaphoretic.  HENT:     Head: Normocephalic and atraumatic.  Eyes:     General: No scleral icterus.       Right eye: No discharge.        Left eye: No discharge.     Extraocular Movements: Extraocular movements intact.     Conjunctiva/sclera: Conjunctivae normal.  Cardiovascular:     Rate and Rhythm: Normal rate and regular rhythm.     Pulses: Normal pulses.     Heart sounds: Normal heart sounds. No murmur heard.    No friction rub. No gallop.  Pulmonary:     Effort: Pulmonary effort is normal. No respiratory distress.     Breath sounds: No stridor. No wheezing, rhonchi or rales.  Chest:     Chest wall: Tenderness (Central sternal chest wall tenderness noted palpation that radiates to left side) present.  Abdominal:     General: Abdomen is flat. There is no distension.     Palpations: Abdomen is soft.     Tenderness: There is no abdominal tenderness. There is no right CVA tenderness, left CVA tenderness, guarding or  rebound.  Musculoskeletal:        General: No swelling, deformity or signs of injury.     Cervical back: Normal range of motion. No rigidity.     Right lower leg: No edema.     Left lower leg: No edema.  Skin:    General: Skin is warm and dry.     Findings: No bruising, erythema or lesion.  Neurological:     General: No focal deficit present.     Mental Status: He is alert and oriented to person, place, and time. Mental status is at baseline.     Sensory: No sensory deficit.     Motor: No weakness.  Psychiatric:        Mood and Affect: Mood normal.     (all labs ordered are listed, but only abnormal results are displayed) Labs Reviewed  BASIC METABOLIC PANEL WITH GFR - Abnormal; Notable for the following components:      Result Value    Glucose, Bld 119 (*)    All other components within normal limits  TROPONIN T, HIGH SENSITIVITY - Abnormal; Notable for the following components:   Troponin T High Sensitivity 263 (*)    All other components within normal limits  TROPONIN T, HIGH SENSITIVITY - Abnormal; Notable for the following components:   Troponin T High Sensitivity 385 (*)    All other components within normal limits  CBC  ETHANOL    EKG: EKG Interpretation Date/Time:  Wednesday February 25 2024 11:36:51 EDT Ventricular Rate:  57 PR Interval:  164 QRS Duration:  90 QT Interval:  382 QTC Calculation: 371 R Axis:   90  Text Interpretation: Sinus bradycardia Rightward axis ST elevation, consider early repolarization, pericarditis, or injury Nonspecific ST and T wave abnormality Abnormal ECG No previous ECGs available Confirmed by Lenor Hollering [phone]) on 02/25/2024 12:32:47 PM  Radiology: CT Angio Chest Aorta W and/or Wo Contrast Result Date: 02/25/2024 CLINICAL DATA:  Acute aortic syndrome (AAS) suspected. Chest pain. Constant. EXAM: CT ANGIOGRAPHY CHEST WITH CONTRAST TECHNIQUE: Multidetector CT imaging of the chest was performed using the standard protocol during bolus administration of intravenous contrast. Multiplanar CT image reconstructions and MIPs were obtained to evaluate the vascular anatomy. RADIATION DOSE REDUCTION: This exam was performed according to the departmental dose-optimization program which includes automated exposure control, adjustment of the mA and/or kV according to patient size and/or use of iterative reconstruction technique. CONTRAST:  OMNIPAQUE  IOHEXOL  350 MG/ML SOLN COMPARISON:  None Available. FINDINGS: Cardiovascular: Evaluation is limited due to patient's motion during data acquisition. There is significant motion especially at the level of aortic arch/main pulmonary trunk region. Preferential opacification of the thoracic aorta. No evidence of thoracic aortic aneurysm, dissection,  penetrating atherosclerotic ulcer or vasculitis. Normal heart size. No pericardial effusion. There is also satisfactory opacification of pulmonary arteries. No evidence of embolism to the proximal subsegmental pulmonary artery level. No aortic aneurysm. Mediastinum/Nodes: Visualized thyroid gland appears grossly unremarkable. No solid / cystic mediastinal masses. The esophagus is nondistended precluding optimal assessment. No axillary, mediastinal or hilar lymphadenopathy by size criteria. Lungs/Pleura: The central tracheo-bronchial tree is patent. No mass or consolidation. No pleural effusion or pneumothorax. No suspicious lung nodules. Upper Abdomen: There is an approximately 1.3 x 1.5 cm simple cyst in the left kidney upper pole, posteriorly. Remaining visualized upper abdominal viscera within normal limits. Musculoskeletal: The visualized soft tissues of the chest wall are grossly unremarkable. No suspicious osseous lesions. Review of the MIP images confirms the above  findings. IMPRESSION: 1. Evaluation is limited due to patient's motion during data acquisition. No evidence of thoracic aortic aneurysm, dissection or vasculitis. No evidence of pulmonary embolism to the proximal subsegmental pulmonary artery level. 2. No lung mass, consolidation, pleural effusion or pneumothorax. 3. Essentially unremarkable exam, as described above. Electronically Signed   By: Ree Molt M.D.   On: 02/25/2024 14:37   DG Chest 2 View Result Date: 02/25/2024 CLINICAL DATA:  Chest pain. EXAM: CHEST - 2 VIEW COMPARISON:  Chest radiograph dated 12/25/2015. FINDINGS: No focal consolidation, pleural effusion, pneumothorax. The cardiac silhouette is within normal limits. No acute osseous pathology. IMPRESSION: No active cardiopulmonary disease. Electronically Signed   By: Vanetta Chou M.D.   On: 02/25/2024 12:11   DG Knee Complete 4 Views Right Result Date: 02/23/2024 CLINICAL DATA:  Injury skip EXAM: RIGHT KNEE - COMPLETE  4+ VIEW COMPARISON:  None Available. FINDINGS: No acute fracture or dislocation identified. Joint effusion present. No evidence of arthropathy or other focal bone abnormality. There is anterior and lateral knee soft tissue swelling. There is also soft tissue thickening in the region of the patellar tendon. IMPRESSION: 1. No acute fracture or dislocation. 2. Joint effusion. 3. Anterior and lateral knee soft tissue swelling. Electronically Signed   By: Greig Pique M.D.   On: 02/23/2024 16:34     Procedures   Medications Ordered in the ED  aspirin  chewable tablet 81 mg (has no administration in time range)  iohexol  (OMNIPAQUE ) 350 MG/ML injection 100 mL (100 mLs Intravenous Contrast Given 02/25/24 1423)    Clinical Course as of 02/25/24 1513  Wed Feb 25, 2024  1321 Spoke with Dr. [place] with cardiology who wished for the patient to being admitted for observation and to admit to medicine due to patient's history of drinking and they will consult. Did not think ACS but would have him admitted for repeat trops and cardiac workup.  [CB]    Clinical Course User Index [CB] Beola Terrall RAMAN, PA-C                                 Medical Decision Making Amount and/or Complexity of Data Reviewed Labs: ordered. Radiology: ordered.  Risk Prescription drug management.   This patient is a 23 year old male who presents to the ED for concern of acute onset chest pain that began yesterday has been progressively worse today, worse on exertion and described as sharp and stabbing radiating to left chest and back.  Denies any heart history previous.  Denies any recent illnesses.  No family history of early heart disease.  On physical exam, patient is in no acute distress, afebrile, alert and orient x 4, speaking in full sentences, nontachypneic, nontachycardic.  Notably does have some central sternal chest tenderness to palpation that radiates to the left chest.  No ecchymosis or bruising, LCTAB, RRR, no  murmur.  Labs noted an elevated troponin but otherwise unremarkable.  Point-of-care ultrasound was done by [employer] PA-C.  Did not see any cardiac tamponade.  Noted to have a near normal EF.  Repeat delta was also elevated, CT angio chest was ordered for dissection concerns, after speaking with attending who wished to have CT study done. Study was negative for PE or dissection.  Spoke with Dr. [place] with cardiology who wished for the patient be admitted for observation intermittent medicine due to patient's history of drinking.  They will consult and follow the patient.  On reevaluation, patient notes that his pain is significantly improved at this time.  Will look to admit to medicine per cardiology with cardiology following.  Patient is currently chest pain-free at this time.  Spoke with Dr. Dennise with internal medicine, patient care transferred over at that time.  Differential diagnoses prior to evaluation: The emergent differential diagnosis includes, but is not limited to, ACS, AAS, Pulmonary Embolism, Tension Pneumothorax, Esophageal Rupture, Cardiac Tamponade, Pericarditis, Myocarditis, Pneumothorax, Pneumonia, Aortic Stenosis, CHF Exacerbation, GERD,  Esophageal Spasm,  Mallory-Weiss, Costochondritis, Musculoskeletal Chest Wall Pain, Anxiety / Panic Attack. This is not an exhaustive differential.   Past Medical History / Co-morbidities / Social History: No chronic past medical history  Additional history: Chart reviewed. Pertinent results include:   Seen by urgent care 2 days ago for acute right knee pain diagnosed with knee sprain versus ligament tear, scheduled Ortho follow-up in provide ibuprofen  and told to RICE at home.  Lab Tests/Imaging studies: I personally interpreted labs/imaging and the pertinent results include:   CBC unremarkable BMP unremarkable Ethanol unremarkable Troponin 263 with elevated delta of 385  Chest x-ray unremarkable CT angio of chest aorta  with and without contrast was also unremarkable  I agree with the radiologist interpretation.  Cardiac monitoring: EKG obtained and interpreted by myself and attending physician which shows: Sinus bradycardia with ST elevation suspicious for pericarditis  EKG Interpretation Date/Time:  Wednesday February 25 2024 11:36:51 EDT Ventricular Rate:  57 PR Interval:  164 QRS Duration:  90 QT Interval:  382 QTC Calculation: 371 R Axis:   90  Text Interpretation: Sinus bradycardia Rightward axis ST elevation, consider early repolarization, pericarditis, or injury Nonspecific ST and T wave abnormality Abnormal ECG No previous ECGs available Confirmed by Lenor Hollering [phone]) on 02/25/2024 12:32:47 PM          Medications: I ordered medication including aspirin .  I have reviewed the patients home medicines and have made adjustments as needed.  Critical Interventions: None  Social Determinants of Health: None  Disposition: After consideration of the diagnostic results and the patients response to treatment, I feel that the patient would benefit from admission, with patient care transferred to Dr. Dennise at that time, followed by Dr. [place] with cardiology  Final diagnoses:  Elevated troponin  Precordial pain    ED Discharge Orders     None          Beola Terrall RAMAN, PA-C 02/25/24 1514    Lenor Hollering, MD 02/26/24 [phone]

## 2024-02-25 NOTE — Progress Notes (Deleted)
 PHARMACY - ANTICOAGULATION CONSULT NOTE  Pharmacy Consult for Heparin  Indication: chest pain/ACS  No Known Allergies  Patient Measurements: Height: 5' 10 (177.8 cm) Weight: 109 kg (240 lb 4.8 oz) IBW/kg (Calculated) : 73 HEPARIN  DW (KG): 95.2  Vital Signs: Temp: 98.5 F (36.9 C) (07/30 1943) Temp Source: Oral (07/30 1943) BP: 131/74 (07/30 1943) Pulse Rate: 72 (07/30 1943)  Labs: Recent Labs    02/25/24 1144  HGB 16.0  HCT 47.4  PLT 175  CREATININE 1.13    Estimated Creatinine Clearance: 126.8 mL/min (by C-G formula based on SCr of 1.13 mg/dL).   Medical History: History reviewed. No pertinent past medical history.  Assessment: 23 year old male to begin heparin  for chest pain.  Goal of Therapy:  Heparin  level 0.3-0.7 units/ml Monitor platelets by anticoagulation protocol: Yes   Plan:  Heparin  4000 units iv bolus x 1 Heparin  drip at 1400 units / hr Heparin  level in 6 hours Daily heparin  level, CBC  Thank you. Olam Monte, PharmD  02/25/2024,7:57 PM

## 2024-02-25 NOTE — ED Notes (Signed)
Scott Gardner with cl called for transport

## 2024-02-25 NOTE — Consult Note (Addendum)
 Cardiology Consultation   Patient ID: Scott Gardner MRN: [id number]; DOB: Aug 20, 2000  Admit date: 02/25/2024 Date of Consult: 02/25/2024  PCP:  Freddrick, No   [facility] Providers Cardiologist:  None        Patient Profile: Scott Gardner is a 23 y.o. male with a hx of ETOH use, tobacco who is being seen 02/25/2024 for the evaluation of elevated troponin at the request of Terrall First PA-C.  History of Present Illness:  Scott Gardner has no prior cardiac history. His dad had a stroke at 9 and his dad's extended family had some family members with heart disease but details not available. He currently smokes. Over the weekend he had a heavy weekend of drinking. He states this was not completely out of the norm. On Sunday they went to a pool party and he did a cannonball into the water  and hit his knee on the ground. He was subsequently seen at [facility] and given ibuprofen  and a knee brace. Yesterday afternoon he had about an hour's worth of chest pain/pressure, unprovoked. He ended up going to sleep to see if that helped and when he woke up he was pain free. This AM he woke up feeling totally fine. He went to work and received a phone call from his mom that was stressful. Subsequent to this he developed recurrent chest pain/tightness with some radiation to his throat. No SOB, palpitations, nausea, diaphoresis, syncope. He states he panicked and went home and took some beet juice and ibuprofen  then went to the [facility]. From 10am-1pm had the severe chest pain, then gradually eased off on its own. He developed recurrent slight discomfort around 2:30pm, received 81mg  of ASA at 334pm, and since that time has overall felt well. He can still feel some slight awareness of the discomfort if he focuses on it or laughs. He denies specific pleuritic symptoms - can maybe feel it more if he takes a deep breath slowly but not sure if this is because he is focusing on it. Has had some tenderness  to palpation as well.  Labs show normal CBC, hsTroponin 263->385. EKG showed sinus bradycardia 57bpm ST elevation II, avF, V3-V6, TWI III. No prior to compare to. Case was discussed with Dr. [place] who did not feel this represented STEMI. He was transferred to [facility] for cardiology evaluation. CXR NAD. CTA limited by motion but no evidence of PE to proximal subsegmental level, no TAA/dissection or vasculitis.  Per ED note Point-of-care ultrasound was done by [facility].  Did not see any cardiac tamponade.  Noted to have a near normal EF.  Past Medical History:  Diagnosis Date   Tobacco use     History reviewed. No pertinent surgical history.   Home Medications:  Prior to Admission medications   Medication Sig Start Date End Date Taking? Authorizing Provider  ibuprofen  (ADVIL ) 800 MG tablet Take 1 tablet (800 mg total) by mouth every 8 (eight) hours as needed (pain). Take with food to avoid stomach upset. Do not take any additional NSAIDs while on this. You may take tylenol  in addition to this if needed for extra pain relief. 02/23/24   Iola Lukes, FNP    Scheduled Meds:  aspirin   243 mg Oral Once   [START ON 02/26/2024] aspirin  EC  81 mg Oral Daily   pantoprazole   40 mg Oral Daily   sodium chloride  flush  3 mL Intravenous Q12H   sodium chloride  flush  3 mL Intravenous Q12H  Continuous Infusions:  sodium chloride      PRN Meds: sodium chloride , acetaminophen  **OR** acetaminophen , ondansetron  **OR** ondansetron  (ZOFRAN ) IV, sodium chloride  flush  Allergies:   No Known Allergies  Social History:   Social History   Socioeconomic History   Marital status: Single    Spouse name: Not on file   Number of children: Not on file   Years of education: Not on file   Highest education level: Not on file  Occupational History   Not on file  Tobacco Use   Smoking status: Every Day    Types: E-cigarettes   Smokeless tobacco: Never  Vaping Use   Vaping status: Some Days    Substances: Nicotine, Flavoring  Substance and Sexual Activity   Alcohol use: Yes   Drug use: No   Sexual activity: Never  Other Topics Concern   Not on file  Social History Narrative   Not on file   Social Drivers of Health   Financial Resource Strain: Not on file  Food Insecurity: No Food Insecurity (02/25/2024)   Hunger Vital Sign    Worried About Running Out of Food in the Last Year: Never true    Ran Out of Food in the Last Year: Never true  Transportation Needs: No Transportation Needs (02/25/2024)   PRAPARE - Administrator, Civil Service (Medical): No    Lack of Transportation (Non-Medical): No  Physical Activity: Not on file  Stress: Not on file  Social Connections: Unknown (02/25/2024)   Social Connection and Isolation Panel    Frequency of Communication with Friends and Family: More than three times a week    Frequency of Social Gatherings with Friends and Family: More than three times a week    Attends Religious Services: More than 4 times per year    Active Member of [employer] or Organizations: No    Attends Banker Meetings: Never    Marital Status: Patient declined  Catering manager Violence: Not At Risk (02/25/2024)   Humiliation, Afraid, Rape, and Kick questionnaire    Fear of Current or Ex-Partner: No    Emotionally Abused: No    Physically Abused: No    Sexually Abused: No    Family History:   Family History  Problem Relation Age of Onset   Healthy Mother    Stroke Father      ROS:  Please see the history of present illness.  All other ROS reviewed and negative.     Physical Exam/Data: Vitals:   02/25/24 1647 02/25/24 1715 02/25/24 1943 02/25/24 1954  BP:  119/73 131/74 118/65  Pulse:  63 72 71  Resp:  19 18   Temp: 98.7 F (37.1 C)  98.5 F (36.9 C) 97.6 F (36.4 C)  TempSrc: Oral  Oral   SpO2:  100% 99% 99%  Weight:   109 kg   Height:       No intake or output data in the 24 hours ending 02/25/24 2032    02/25/2024     7:43 PM 02/25/2024   11:39 AM 06/26/2020    4:28 PM  Last 3 Weights  Weight (lbs) 240 lb 4.8 oz 230 lb 230 lb  Weight (kg) 108.999 kg 104.327 kg 104.327 kg     Body mass index is 34.48 kg/m.  General: Well developed, well nourished, in no acute distress. Head: Normocephalic, atraumatic, sclera non-icteric, no xanthomas, nares are without discharge. Neck: Negative for carotid bruits. JVP not elevated. Lungs: Clear bilaterally to  auscultation without wheezes, rales, or rhonchi. Breathing is unlabored. Heart: RRR S1 S2 without murmurs, rubs, or gallops.  Abdomen: Soft, non-tender, non-distended with normoactive bowel sounds. No rebound/guarding. Extremities: No clubbing or cyanosis. No edema. Distal pedal pulses are 2+ and equal bilaterally. Neuro: Alert and oriented X 3. Moves all extremities spontaneously. Psych:  Responds to questions appropriately with a normal affect.   EKG:  The EKG was personally reviewed and demonstrates:  sinus bradycardia 57bpm ST elevation II, avF, V3-V6, TWI III Telemetry:  Telemetry was personally reviewed and demonstrates:  NSR  Relevant CV Studies: None  Laboratory Data: High Sensitivity Troponin:  No results for input(s): TROPONINIHS in the last 720 hours.   Chemistry Recent Labs  Lab 02/25/24 1144  NA 141  K 4.3  CL 105  CO2 28  GLUCOSE 119*  BUN 9  CREATININE 1.13  CALCIUM  9.5  GFRNONAA >60  ANIONGAP 8    No results for input(s): PROT, ALBUMIN, AST, ALT, ALKPHOS, BILITOT in the last 168 hours. Lipids No results for input(s): CHOL, TRIG, HDL, LABVLDL, LDLCALC, CHOLHDL in the last 168 hours.  Hematology Recent Labs  Lab 02/25/24 1144  WBC 7.7  RBC 5.25  HGB 16.0  HCT 47.4  MCV 90.3  MCH 30.5  MCHC 33.8  RDW 12.5  PLT 175   Thyroid No results for input(s): TSH, FREET4 in the last 168 hours.  BNPNo results for input(s): BNP, PROBNP in the last 168 hours.  DDimer No results for input(s):  DDIMER in the last 168 hours.  Radiology/Studies:  CT Angio Chest Aorta W and/or Wo Contrast Result Date: 02/25/2024 CLINICAL DATA:  Acute aortic syndrome (AAS) suspected. Chest pain. Constant. EXAM: CT ANGIOGRAPHY CHEST WITH CONTRAST TECHNIQUE: Multidetector CT imaging of the chest was performed using the standard protocol during bolus administration of intravenous contrast. Multiplanar CT image reconstructions and MIPs were obtained to evaluate the vascular anatomy. RADIATION DOSE REDUCTION: This exam was performed according to the departmental dose-optimization program which includes automated exposure control, adjustment of the mA and/or kV according to patient size and/or use of iterative reconstruction technique. CONTRAST:  OMNIPAQUE  IOHEXOL  350 MG/ML SOLN COMPARISON:  None Available. FINDINGS: Cardiovascular: Evaluation is limited due to patient's motion during data acquisition. There is significant motion especially at the level of aortic arch/main pulmonary trunk region. Preferential opacification of the thoracic aorta. No evidence of thoracic aortic aneurysm, dissection, penetrating atherosclerotic ulcer or vasculitis. Normal heart size. No pericardial effusion. There is also satisfactory opacification of pulmonary arteries. No evidence of embolism to the proximal subsegmental pulmonary artery level. No aortic aneurysm. Mediastinum/Nodes: Visualized thyroid gland appears grossly unremarkable. No solid / cystic mediastinal masses. The esophagus is nondistended precluding optimal assessment. No axillary, mediastinal or hilar lymphadenopathy by size criteria. Lungs/Pleura: The central tracheo-bronchial tree is patent. No mass or consolidation. No pleural effusion or pneumothorax. No suspicious lung nodules. Upper Abdomen: There is an approximately 1.3 x 1.5 cm simple cyst in the left kidney upper pole, posteriorly. Remaining visualized upper abdominal viscera within normal limits. Musculoskeletal:  The visualized soft tissues of the chest wall are grossly unremarkable. No suspicious osseous lesions. Review of the MIP images confirms the above findings. IMPRESSION: 1. Evaluation is limited due to patient's motion during data acquisition. No evidence of thoracic aortic aneurysm, dissection or vasculitis. No evidence of pulmonary embolism to the proximal subsegmental pulmonary artery level. 2. No lung mass, consolidation, pleural effusion or pneumothorax. 3. Essentially unremarkable exam, as described above. Electronically Signed  By: Ree Molt M.D.   On: 02/25/2024 14:37   DG Chest 2 View Result Date: 02/25/2024 CLINICAL DATA:  Chest pain. EXAM: CHEST - 2 VIEW COMPARISON:  Chest radiograph dated 12/25/2015. FINDINGS: No focal consolidation, pleural effusion, pneumothorax. The cardiac silhouette is within normal limits. No acute osseous pathology. IMPRESSION: No active cardiopulmonary disease. Electronically Signed   By: Vanetta Chou M.D.   On: 02/25/2024 12:11   DG Knee Complete 4 Views Right Result Date: 02/23/2024 CLINICAL DATA:  Injury skip EXAM: RIGHT KNEE - COMPLETE 4+ VIEW COMPARISON:  None Available. FINDINGS: No acute fracture or dislocation identified. Joint effusion present. No evidence of arthropathy or other focal bone abnormality. There is anterior and lateral knee soft tissue swelling. There is also soft tissue thickening in the region of the patellar tendon. IMPRESSION: 1. No acute fracture or dislocation. 2. Joint effusion. 3. Anterior and lateral knee soft tissue swelling. Electronically Signed   By: Greig Pique M.D.   On: 02/23/2024 16:34     Assessment and Plan:  1. Chest pain with mixed features, abnormal EKG, elevated troponin - discussed case with Dr. Loni. At this time unclear what precipitated the above. Ddx includes ACS which would be unusual in this age population, SCAD which would be unusual in male, contusion, myocarditis, or pericarditis. Per our  discussion enact the following - hold off IV heparin  unless f/u enzymes escalate further in case this does represent SCAD - notified fellow to follow this up - 2D echocardiogram - give 234mg  ASA x1 to supplement 81mg  given earlier, start 81mg  daily - check ESR, CRP, UDS for completeness - depending on values could consider empiric rx for pericarditis in AM - clear liquids after MN for further recs on workup depending on troponin trends. I sent msg to cardmaster inbox for rounding team to see early for decision  2. Mild hyperglycemia - A1C in AM  3. Recent knee injury - per primary team   Risk Assessment/Risk Scores:    TIMI Risk Score for Unstable Angina or Non-ST Elevation MI:   The patient's TIMI risk score is 2, which indicates a 8% risk of all cause mortality, new or recurrent myocardial infarction or need for urgent revascularization in the next 14 days.         For questions or updates, please contact [facility] Please consult [email or web address] for contact info under    Signed, Raphael LOISE Bring, PA-C  02/25/2024 8:32 PM  Patient seen and examined with Dayna Dunn PA-C.  Agree as above, with the following exceptions and changes as noted below.  23 year old male with excessive alcohol consumption this weekend and a stressful conversation with his mother today prompting presentation to the ER due to 10 out of 10 chest pain now with elevated troponins concerning for myocardial injury.  Patient notes he had significant alcohol consumption though not entirely out of the ordinary, and he denies illicit substance use.  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  Presented to the ER where EKG was evaluated for STEMI and ruled out for STEMI by STEMI physician.  EKG demonstrates early repolarization pattern in the inferior leads with T wave abnormality in 3 and aVF and early  repolarization pattern in V3 through V6 slight PR depression diffusely.  Gen: NAD, CV: RRR, no murmurs, no pericardial rub, Lungs: clear, Abd: soft, Extrem: Warm, well perfused,  no edema, Neuro/Psych: alert and oriented x 3, normal mood and affect. All available labs, radiology testing, previous records reviewed.  I independently reviewed the CT angio chest study obtained the ER ordered to rule out dissection.  Significant motion artifact limits assessment but coronary origins and course appear normal, no pericardial effusion is noted, no obvious congenital heart disease is seen, no coronary calcifications.  It would be very unusual for a 23 year old to have ACS from plaque rupture, however with mildly elevated troponins this is not off the differential.  My leading suspicion at this point is myocarditis, possibly related to alcohol intoxication.  This could potentially represent scad though this is more commonly seen in young females, we will evaluate with trending troponins.  Will check ESR, CRP, and agree with checking UDS for completeness in an abundance of caution.  Unclear indications for heparin  at this time given unclear etiology of the troponin elevation, if troponin continues to rise, may be reasonable to consider heparin , though again scad remains on the differential.  Echocardiogram in the morning, consider cardiac MRI tomorrow to evaluate for myocarditis.  Patient is not yet chest pain-free.  Terrian Sentell A Ruthy Forry, MD 02/25/24 8:47 PM

## 2024-02-25 NOTE — Progress Notes (Signed)
 PHARMACY - ANTICOAGULATION CONSULT NOTE  Pharmacy Consult for Heparin  Indication: chest pain/ACS  No Known Allergies  Patient Measurements: Height: 5' 10 (177.8 cm) Weight: 109 kg (240 lb 4.8 oz) IBW/kg (Calculated) : 73 HEPARIN  DW (KG): 95.2  Vital Signs: Temp: 97.6 F (36.4 C) (07/30 1954) Temp Source: Oral (07/30 1943) BP: 118/65 (07/30 1954) Pulse Rate: 71 (07/30 1954)  Labs: Recent Labs    02/25/24 1144 02/25/24 1953  HGB 16.0  --   HCT 47.4  --   PLT 175  --   CREATININE 1.13  --   TROPONINIHS  --  8,509*    Estimated Creatinine Clearance: 126.8 mL/min (by C-G formula based on SCr of 1.13 mg/dL).   Medical History: Past Medical History:  Diagnosis Date   Tobacco use     Assessment: 23 year old male to begin heparin  for chest pain. No AC PTA.  Hgb 16, plt 175. Trop came back elevated at 8509.   Goal of Therapy:  Heparin  level 0.3-0.7 units/ml Monitor platelets by anticoagulation protocol: Yes   Plan:  Heparin  4000 units IV bolus x 1 Heparin  drip at 1200 units / hr Heparin  level in 6 hours Daily heparin  level, CBC  Thank you for allowing pharmacy to participate in this patient's care,  Suzen Sour, PharmD, BCCCP Clinical Pharmacist  Phone: [phone] 02/25/2024 9:26 PM  Please check AMION for all [facility] phone numbers After 10:00 PM, call Main Pharmacy [phone]

## 2024-02-25 NOTE — ED Triage Notes (Signed)
 Patient reports chest pain starting yesterday but it is worse today. He reports pain from the center of his chest up to this throat. He states it is constant. He reports no injury to the chest area but said he hurt his knee a couple days ago. Otherwise no signs of distress.

## 2024-02-25 NOTE — Progress Notes (Signed)
 The patient is admitted fro [facility] ED to 3 E 06. He's A & O x 4. Denies any acute right now.  The patient is oriented to his room, ascom/call bell and staff. Notified  the cardilogist, Dunn PA who happened to be at the unit and she siad she;s going to see the patient soon. Also notified the oncall admission coordinator the let the Hospitalist know of his arrival. Will continue to monitor

## 2024-02-25 NOTE — Progress Notes (Incomplete)
 Lab just called with Troponin of 8509. Dr. Sundil Notified

## 2024-02-25 NOTE — Plan of Care (Addendum)
 Reviewed repeat EKG which is unchanged as compared to previous EKG. ST wave abnormality in lead II, III and aVF which is unchanged as compared to previous EKG. Reviewed with cardiology as well.  Agrees to continue heparin  drip and recommended if patient develops any chest pain need to inform to cardiology immediately.

## 2024-02-26 ENCOUNTER — Observation Stay (HOSPITAL_COMMUNITY)

## 2024-02-26 ENCOUNTER — Encounter (HOSPITAL_COMMUNITY)
Admission: EM | Disposition: A | Discharge status: Home / Self Care | Payer: Self-pay | Source: Home / Self Care | Attending: Internal Medicine

## 2024-02-26 ENCOUNTER — Encounter (HOSPITAL_COMMUNITY): Payer: Self-pay | Admitting: Cardiovascular Disease

## 2024-02-26 DIAGNOSIS — I214 Non-ST elevation (NSTEMI) myocardial infarction: Secondary | ICD-10-CM

## 2024-02-26 DIAGNOSIS — M25561 Pain in right knee: Secondary | ICD-10-CM | POA: Diagnosis present

## 2024-02-26 DIAGNOSIS — F17290 Nicotine dependence, other tobacco product, uncomplicated: Secondary | ICD-10-CM | POA: Diagnosis present

## 2024-02-26 DIAGNOSIS — R7989 Other specified abnormal findings of blood chemistry: Secondary | ICD-10-CM

## 2024-02-26 DIAGNOSIS — E669 Obesity, unspecified: Secondary | ICD-10-CM | POA: Diagnosis present

## 2024-02-26 DIAGNOSIS — Z79899 Other long term (current) drug therapy: Secondary | ICD-10-CM | POA: Diagnosis not present

## 2024-02-26 DIAGNOSIS — R079 Chest pain, unspecified: Secondary | ICD-10-CM | POA: Diagnosis not present

## 2024-02-26 DIAGNOSIS — Z8249 Family history of ischemic heart disease and other diseases of the circulatory system: Secondary | ICD-10-CM | POA: Diagnosis not present

## 2024-02-26 DIAGNOSIS — I401 Isolated myocarditis: Secondary | ICD-10-CM | POA: Diagnosis not present

## 2024-02-26 DIAGNOSIS — Z6834 Body mass index (BMI) 34.0-34.9, adult: Secondary | ICD-10-CM | POA: Diagnosis not present

## 2024-02-26 DIAGNOSIS — R072 Precordial pain: Secondary | ICD-10-CM | POA: Diagnosis present

## 2024-02-26 DIAGNOSIS — Z823 Family history of stroke: Secondary | ICD-10-CM | POA: Diagnosis not present

## 2024-02-26 DIAGNOSIS — I409 Acute myocarditis, unspecified: Secondary | ICD-10-CM | POA: Diagnosis not present

## 2024-02-26 DIAGNOSIS — I5A Non-ischemic myocardial injury (non-traumatic): Secondary | ICD-10-CM | POA: Diagnosis present

## 2024-02-26 HISTORY — PX: LEFT HEART CATH AND CORONARY ANGIOGRAPHY: CATH118249

## 2024-02-26 LAB — COMPREHENSIVE METABOLIC PANEL WITH GFR
ALT: 21 U/L (ref 0–44)
AST: 38 U/L (ref 15–41)
Albumin: 3.5 g/dL (ref 3.5–5.0)
Alkaline Phosphatase: 48 U/L (ref 38–126)
Anion gap: 7 (ref 5–15)
BUN: 9 mg/dL (ref 6–20)
CO2: 23 mmol/L (ref 22–32)
Calcium: 8.6 mg/dL — ABNORMAL LOW (ref 8.9–10.3)
Chloride: 106 mmol/L (ref 98–111)
Creatinine, Ser: 1.01 mg/dL (ref 0.61–1.24)
GFR, Estimated: >60 mL/min (ref >60)
Glucose, Bld: 92 mg/dL (ref 70–99)
Potassium: 3.8 mmol/L (ref 3.5–5.1)
Sodium: 136 mmol/L (ref 135–145)
Total Bilirubin: 0.7 mg/dL (ref 0.0–1.2)
Total Protein: 6.2 g/dL — ABNORMAL LOW (ref 6.5–8.1)

## 2024-02-26 LAB — TROPONIN I (HIGH SENSITIVITY)
Troponin I (High Sensitivity): 4219 ng/L (ref <18)
Troponin I (High Sensitivity): 4625 ng/L (ref <18)
Troponin I (High Sensitivity): 5665 ng/L (ref <18)
Troponin I (High Sensitivity): 5731 ng/L (ref <18)
Troponin I (High Sensitivity): 6541 ng/L (ref <18)
Troponin I (High Sensitivity): 6621 ng/L (ref <18)

## 2024-02-26 LAB — CBC
HCT: 46.5 % (ref 39.0–52.0)
Hemoglobin: 15.5 g/dL (ref 13.0–17.0)
MCH: 30.3 pg (ref 26.0–34.0)
MCHC: 33.3 g/dL (ref 30.0–36.0)
MCV: 90.8 fL (ref 80.0–100.0)
Platelets: 171 K/uL (ref 150–400)
RBC: 5.12 MIL/uL (ref 4.22–5.81)
RDW: 12.4 % (ref 11.5–15.5)
WBC: 10.9 K/uL — ABNORMAL HIGH (ref 4.0–10.5)
nRBC: 0 % (ref 0.0–0.2)

## 2024-02-26 LAB — LIPID PANEL
Cholesterol: 102 mg/dL (ref 0–200)
HDL: 39 mg/dL — ABNORMAL LOW (ref >40)
LDL Cholesterol: 49 mg/dL (ref 0–99)
Total CHOL/HDL Ratio: 2.6 ratio
Triglycerides: 68 mg/dL (ref <150)
VLDL: 14 mg/dL (ref 0–40)

## 2024-02-26 LAB — ECHOCARDIOGRAM COMPLETE
Area-P 1/2: 3.17 cm2
Height: 70 in
S' Lateral: 3.3 cm
Weight: 3803.2 [oz_av]

## 2024-02-26 LAB — HEPARIN LEVEL (UNFRACTIONATED): Heparin Unfractionated: 0.24 [IU]/mL — ABNORMAL LOW (ref 0.30–0.70)

## 2024-02-26 SURGERY — LEFT HEART CATH AND CORONARY ANGIOGRAPHY
Anesthesia: LOCAL

## 2024-02-26 MED ORDER — HEPARIN SODIUM (PORCINE) 1000 UNIT/ML IJ SOLN
INTRAMUSCULAR | Status: DC | PRN
  Administered 2024-02-26: 5000 [IU] via INTRAVENOUS

## 2024-02-26 MED ORDER — FENTANYL CITRATE (PF) 100 MCG/2ML IJ SOLN
INTRAMUSCULAR | Status: DC | PRN
  Administered 2024-02-26: 50 ug via INTRAVENOUS

## 2024-02-26 MED ORDER — COLCHICINE 0.6 MG PO TABS
0.6000 mg | ORAL_TABLET | Freq: Two times a day (BID) | ORAL | Status: DC
  Administered 2024-02-26 – 2024-02-27 (×3): 0.6 mg via ORAL
  Filled 2024-02-26 (×3): qty 1

## 2024-02-26 MED ORDER — LABETALOL HCL 5 MG/ML IV SOLN: 10.0000 mg | INTRAVENOUS | Status: AC | PRN

## 2024-02-26 MED ORDER — IOHEXOL 350 MG/ML SOLN
INTRAVENOUS | Status: DC | PRN
  Administered 2024-02-26: 80 mL

## 2024-02-26 MED ORDER — HYDRALAZINE HCL 20 MG/ML IJ SOLN: 10.0000 mg | INTRAMUSCULAR | Status: AC | PRN

## 2024-02-26 MED ORDER — IBUPROFEN 600 MG PO TABS
600.0000 mg | ORAL_TABLET | Freq: Three times a day (TID) | ORAL | Status: DC
  Administered 2024-02-26 – 2024-02-27 (×3): 600 mg via ORAL
  Filled 2024-02-26: qty 1
  Filled 2024-02-26 (×3): qty 3
  Filled 2024-02-26 (×2): qty 1

## 2024-02-26 MED ORDER — VERAPAMIL HCL 2.5 MG/ML IV SOLN
INTRAVENOUS | Status: AC
  Filled 2024-02-26: qty 2

## 2024-02-26 MED ORDER — LIDOCAINE HCL (PF) 1 % IJ SOLN
INTRAMUSCULAR | Status: AC
  Filled 2024-02-26: qty 30

## 2024-02-26 MED ORDER — SODIUM CHLORIDE 0.9% FLUSH
3.0000 mL | INTRAVENOUS | Status: DC | PRN
Start: 2024-02-26 — End: 2024-02-27

## 2024-02-26 MED ORDER — FREE WATER
500.0000 mL | Freq: Once | Status: AC
  Administered 2024-02-26: 500 mL via ORAL

## 2024-02-26 MED ORDER — PANTOPRAZOLE SODIUM 40 MG PO TBEC
40.0000 mg | DELAYED_RELEASE_TABLET | Freq: Every day | ORAL | Status: DC
Start: 2024-02-26 — End: 2024-02-27
  Administered 2024-02-26 – 2024-02-27 (×2): 40 mg via ORAL
  Filled 2024-02-26 (×2): qty 1

## 2024-02-26 MED ORDER — HEPARIN (PORCINE) IN NACL 1000-0.9 UT/500ML-% IV SOLN
INTRAVENOUS | Status: DC | PRN
  Administered 2024-02-26 (×2): 500 mL

## 2024-02-26 MED ORDER — MIDAZOLAM HCL 2 MG/2ML IJ SOLN
INTRAMUSCULAR | Status: AC
Start: 2024-02-26 — End: 2024-02-26
  Filled 2024-02-26: qty 2

## 2024-02-26 MED ORDER — FENTANYL CITRATE (PF) 100 MCG/2ML IJ SOLN
INTRAMUSCULAR | Status: AC
Start: 2024-02-26 — End: 2024-02-26
  Filled 2024-02-26: qty 2

## 2024-02-26 MED ORDER — NITROGLYCERIN 0.4 MG SL SUBL
0.4000 mg | SUBLINGUAL_TABLET | SUBLINGUAL | Status: DC | PRN
Start: 2024-02-26 — End: 2024-02-27
  Administered 2024-02-26: 0.4 mg via SUBLINGUAL

## 2024-02-26 MED ORDER — SODIUM CHLORIDE 0.9% FLUSH
3.0000 mL | Freq: Two times a day (BID) | INTRAVENOUS | Status: DC
  Administered 2024-02-26 – 2024-02-27 (×3): 3 mL via INTRAVENOUS

## 2024-02-26 MED ORDER — SODIUM CHLORIDE 0.9 % IV SOLN: 250.0000 mL | INTRAVENOUS | Status: DC | PRN

## 2024-02-26 MED ORDER — NITROGLYCERIN 0.4 MG SL SUBL
SUBLINGUAL_TABLET | SUBLINGUAL | Status: AC
  Filled 2024-02-26: qty 1

## 2024-02-26 MED ORDER — MIDAZOLAM HCL 2 MG/2ML IJ SOLN
INTRAMUSCULAR | Status: DC | PRN
  Administered 2024-02-26: 2 mg via INTRAVENOUS

## 2024-02-26 MED ORDER — HEPARIN SODIUM (PORCINE) 1000 UNIT/ML IJ SOLN
INTRAMUSCULAR | Status: AC
  Filled 2024-02-26: qty 10

## 2024-02-26 MED ORDER — VERAPAMIL HCL 2.5 MG/ML IV SOLN
INTRAVENOUS | Status: DC | PRN
  Administered 2024-02-26: 10 mL via INTRA_ARTERIAL

## 2024-02-26 SURGICAL SUPPLY — 10 items
CATH 5FR JL3.5 JR4 ANG PIG MP (CATHETERS) IMPLANT
CATH INFINITI 5 FR AL2 (CATHETERS) IMPLANT
CATH INFINITI 5FR JL4 (CATHETERS) IMPLANT
CATH LAUNCHER 5F EBU3.0 (CATHETERS) IMPLANT
CATH VISTA GUIDE 6FR XBLD 3.5 (CATHETERS) IMPLANT
DEVICE RAD COMP TR BAND LRG (VASCULAR PRODUCTS) IMPLANT
GLIDESHEATH SLEND SS 6F .021 (SHEATH) IMPLANT
GUIDEWIRE INQWIRE 1.5J.035X260 (WIRE) IMPLANT
PACK CARDIAC CATHETERIZATION (CUSTOM PROCEDURE TRAY) ×1 IMPLANT
SET ATX-X65L (MISCELLANEOUS) IMPLANT

## 2024-02-26 NOTE — Progress Notes (Signed)
 Echocardiogram 2D Echocardiogram has been performed.  Damien FALCON Raffaele Derise RDCS 02/26/2024, 12:19 PM

## 2024-02-26 NOTE — Plan of Care (Signed)

## 2024-02-26 NOTE — Progress Notes (Signed)
 I was alerted patient had recurrent chest pain. I went to bedside, patient reported that it felt similar to his prior presentation. He had a stressful call right before onset. 12 lead ECG obtained at that time looks similar to his prior ECG on file, confirmed with Dr. Jeffrie Patient received SL nitroglycerin  with positive effect on his chest pain. Spoke with cardiac cath lab to have patient's case moved sooner this afternoon.   Leontine Salen PA-C

## 2024-02-26 NOTE — Interval H&P Note (Signed)
 History and Physical Interval Note:  02/26/2024 1:54 PM  Scott Gardner  has presented today for surgery, with the diagnosis of nstemi.  The various methods of treatment have been discussed with the patient and family. After consideration of risks, benefits and other options for treatment, the patient has consented to  Procedure(s): LEFT HEART CATH AND CORONARY ANGIOGRAPHY (N/A) as a surgical intervention.  The patient's history has been reviewed, patient examined, no change in status, stable for surgery.  I have reviewed the patient's chart and labs.  Questions were answered to the patient's satisfaction.    Cath Lab Visit (complete for each Cath Lab visit)  Clinical Evaluation Leading to the Procedure:   ACS: Yes.    Non-ACS:    Anginal Classification: CCS III  Anti-ischemic medical therapy: No Therapy  Non-Invasive Test Results: No non-invasive testing performed  Prior CABG: No previous CABG        [name]

## 2024-02-26 NOTE — Plan of Care (Signed)
  Problem: Education: Goal: Knowledge of General Education information will improve Description: Including pain rating scale, medication(s)/side effects and non-pharmacologic comfort measures Outcome: Progressing   Problem: Clinical Measurements: Goal: Will remain free from infection Outcome: Progressing Goal: Respiratory complications will improve Outcome: Progressing   Problem: Activity: Goal: Risk for activity intolerance will decrease Outcome: Progressing   Problem: Nutrition: Goal: Adequate nutrition will be maintained Outcome: Progressing   Problem: Pain Managment: Goal: General experience of comfort will improve and/or be controlled Outcome: Progressing   Problem: Clinical Measurements: Goal: Ability to maintain clinical measurements within normal limits will improve Outcome: Not Progressing Goal: Cardiovascular complication will be avoided Outcome: Not Progressing

## 2024-02-26 NOTE — Progress Notes (Signed)
 Refusing telemonitor and refusing vital signs at this time. Both Joette Pebbles, MD and Oneil Parchment, MD made aware.

## 2024-02-26 NOTE — Progress Notes (Signed)
 Patient started to have chest pain again, stating that it feels like tightness and a little bit of sharp pain. It is radiating from his left side of chest to the back of his shoulder blade. Leontine Salen, [place] and Joette Pebbles, MD were both made aware. PA came to bedside and ordered nitroglycerin  0.4mg  sublingual and it was administered by this nurse. This nurse also did an EKG and it was noted by the PA to appear the same as the previous ones. Still on heparin  drip. Will also apply supplemental oxygen for comfort

## 2024-02-26 NOTE — Progress Notes (Addendum)
 Rounding Note   Patient Name: Scott Gardner Date of Encounter: 02/26/2024  [facility] Cardiologist: Soyla DELENA Merck, MD   Subjective Currently asymptomatic.   Patient shared that his alcohol use has been high this month given family birthdays, however prior to the month of July his alcohol use was approximately 5 beverages a week.   Events leading up to this admission, patient went to [place] with his friends for a birthday.  They rented an air B&B for the weekend, he does endorse heavy drinking this weekend.  He denies any trauma to his chest over the weekend.  He denies other drug use besides vaping.  None of his friends to his knowledge were sick, he denies systemic illness symptoms.  Notes that he did wake up 1 night drenched in sweat no other symptoms.  He does occasionally get palpitations.  No history of syncope.   Patient described the chest pain as a tightness with radiation to his neck.  No other associated symptoms.  His first episode of chest pain went away after he took a nap.  Second chest pain patient endorsed he was speaking with his mother and became anxious, chest pain then did not go away and became concerned and came to the ED. Again no other symptoms during his chest pain episode.   Asked about his family history of cardiac disease, patient shared that his paternal side does have cardiac disease though does have a history of drug use.  His sister was at bedside and shared their father did have a history of high blood pressure and syncope with exertion, however was actively using drugs.  No information on maternal side.   Scheduled Meds:  aspirin  EC  81 mg Oral Daily   folic acid   1 mg Oral Daily   multivitamin with minerals  1 tablet Oral Daily   sodium chloride  flush  3 mL Intravenous Q12H   sodium chloride  flush  3 mL Intravenous Q12H   thiamine   100 mg Oral Daily   Continuous Infusions:  sodium chloride      heparin  1,200 Units/hr  (02/25/24 2224)   PRN Meds: sodium chloride , acetaminophen  **OR** acetaminophen , ondansetron  **OR** ondansetron  (ZOFRAN ) IV, sodium chloride  flush   Vital Signs  Vitals:   02/25/24 1954 02/25/24 2200 02/26/24 0039 02/26/24 0421  BP: 118/65  (!) 107/58 110/65  Pulse: 71  61 65  Resp:  18 20 18   Temp: 97.6 F (36.4 C)  98.6 F (37 C) 97.9 F (36.6 C)  TempSrc:   Oral Oral  SpO2: 99%  99% 98%  Weight:    107.8 kg  Height:        Intake/Output Summary (Last 24 hours) at 02/26/2024 0731 Last data filed at 02/26/2024 0700 Gross per 24 hour  Intake 140.63 ml  Output 400 ml  Net -259.37 ml      02/26/2024    4:21 AM 02/25/2024    7:43 PM 02/25/2024   11:39 AM  Last 3 Weights  Weight (lbs) 237 lb 11.2 oz 240 lb 4.8 oz 230 lb  Weight (kg) 107.82 kg 108.999 kg 104.327 kg      Telemetry Sinus rhythm heart rate 50-75- Personally Reviewed  ECG  Sinus rhythm with TWI in III, diffuse ST elevations VR 70 - Personally Reviewed   Physical Exam GEN: No acute distress.   Neck: No carotid bruits Cardiac: RRR, no murmurs, rubs, or gallops.  No tenderness to chest wall with palpation Respiratory: Clear to auscultation bilaterally.  GI: Soft, nontender, non-distended  MS: No edema; No deformity. Neuro:  Nonfocal  Psych: Normal affect   Labs High Sensitivity Troponin:   Recent Labs  Lab 02/25/24 1953 02/25/24 2357 02/26/24 0240  TROPONINIHS 8,509* 6,541* 6,621*     Chemistry Recent Labs  Lab 02/25/24 1144  NA 141  K 4.3  CL 105  CO2 28  GLUCOSE 119*  BUN 9  CREATININE 1.13  CALCIUM  9.5  GFRNONAA >60  ANIONGAP 8    Lipids  Recent Labs  Lab 02/26/24 0240  CHOL 102  TRIG 68  HDL 39*  LDLCALC 49  CHOLHDL 2.6    Hematology Recent Labs  Lab 02/25/24 1144 02/26/24 0240  WBC 7.7 10.9*  RBC 5.25 5.12  HGB 16.0 15.5  HCT 47.4 46.5  MCV 90.3 90.8  MCH 30.5 30.3  MCHC 33.8 33.3  RDW 12.5 12.4  PLT 175 171   Thyroid  Recent Labs  Lab 02/25/24 1953  TSH  1.427    BNPNo results for input(s): BNP, PROBNP in the last 168 hours.  DDimer No results for input(s): DDIMER in the last 168 hours.   Radiology  CT Angio Chest Aorta W and/or Wo Contrast Result Date: 02/25/2024 CLINICAL DATA:  Acute aortic syndrome (AAS) suspected. Chest pain. Constant. EXAM: CT ANGIOGRAPHY CHEST WITH CONTRAST TECHNIQUE: Multidetector CT imaging of the chest was performed using the standard protocol during bolus administration of intravenous contrast. Multiplanar CT image reconstructions and MIPs were obtained to evaluate the vascular anatomy. RADIATION DOSE REDUCTION: This exam was performed according to the departmental dose-optimization program which includes automated exposure control, adjustment of the mA and/or kV according to patient size and/or use of iterative reconstruction technique. CONTRAST:  OMNIPAQUE  IOHEXOL  350 MG/ML SOLN COMPARISON:  None Available. FINDINGS: Cardiovascular: Evaluation is limited due to patient's motion during data acquisition. There is significant motion especially at the level of aortic arch/main pulmonary trunk region. Preferential opacification of the thoracic aorta. No evidence of thoracic aortic aneurysm, dissection, penetrating atherosclerotic ulcer or vasculitis. Normal heart size. No pericardial effusion. There is also satisfactory opacification of pulmonary arteries. No evidence of embolism to the proximal subsegmental pulmonary artery level. No aortic aneurysm. Mediastinum/Nodes: Visualized thyroid gland appears grossly unremarkable. No solid / cystic mediastinal masses. The esophagus is nondistended precluding optimal assessment. No axillary, mediastinal or hilar lymphadenopathy by size criteria. Lungs/Pleura: The central tracheo-bronchial tree is patent. No mass or consolidation. No pleural effusion or pneumothorax. No suspicious lung nodules. Upper Abdomen: There is an approximately 1.3 x 1.5 cm simple cyst in the left kidney  upper pole, posteriorly. Remaining visualized upper abdominal viscera within normal limits. Musculoskeletal: The visualized soft tissues of the chest wall are grossly unremarkable. No suspicious osseous lesions. Review of the MIP images confirms the above findings. IMPRESSION: 1. Evaluation is limited due to patient's motion during data acquisition. No evidence of thoracic aortic aneurysm, dissection or vasculitis. No evidence of pulmonary embolism to the proximal subsegmental pulmonary artery level. 2. No lung mass, consolidation, pleural effusion or pneumothorax. 3. Essentially unremarkable exam, as described above. Electronically Signed   By: Ree Molt M.D.   On: 02/25/2024 14:37   DG Chest 2 View Result Date: 02/25/2024 CLINICAL DATA:  Chest pain. EXAM: CHEST - 2 VIEW COMPARISON:  Chest radiograph dated 12/25/2015. FINDINGS: No focal consolidation, pleural effusion, pneumothorax. The cardiac silhouette is within normal limits. No acute osseous pathology. IMPRESSION: No active cardiopulmonary disease. Electronically Signed   By: Vanetta Shelia HERO.D.  On: 02/25/2024 12:11   Patient Profile   23 y.o. male with a past medical history of EOTH and tobacco use who presented to the ED on 7/30 for atypical chest pain. Hs troponin I 8509 -> 6541 -> 6621.  EKG shows no ischemic changes .  He was loaded on aspirin , and overnight started on heparin  drip.  Assessment & Plan  Atypical chest pain NSTEMI No prior cardiac history. On my interview chest pain is described as tightness with radiation to neck.  No other associated symptoms.  Patient reported chest pain was not relieved with rest.  CP is not associated with position change. He denies any trauma to his chest wall. Patient does endorse heavy drinking the weekend prior, confirms using [facility] periodically, denied other drug use.  He does vape. On chart review physical exam noted tenderness to chest wall with palpation, though not noted on my physical  exam.  ECG shows signs of possible pericarditis, CRP 1.6 CTA negative for PE and dissection, when asked about the significant motion on the CT patient stated he was confused about the directions denied claustrophobia.  Echocardiogram pending  Etiology more likely to be related to perimyocarditis, though an ischemic evaluation is warranted given the level of hs troponin elevation, will pursue cardiac catheterization. If this is unrevealing will pursue cMR to rule out myocarditis. Patient denied recent illness or vaccine. He was this previously weekend in [place] with [place] sharing vapes so cannout completely rule out viral etiology.  Patient to remain NPO except free water  hydration for procedure today.  Continue ASA 81mg  Continue heparin  gtt, if SCAD will need to discontinue   Lipid panel  LDL 49    HDL 39 Will defer statin therapy for now, will wait for cath results   For questions or updates, please contact [facility] Please consult [email or web address] for contact info under     Signed, Leontine LOISE Salen, PA-C  02/26/2024, 7:31 AM    Personally seen and examined. Agree with above.  23 year old male with troponin now of 8000, J-point elevation fairly diffusely on ECG possible repolarization abnormality with admission with severe chest pain following large consumption of alcohol and cannonball into pool of 3 feet which injured his knee.  Had severe chest discomfort after this 10/10, also after intense discussion with his mother.  Case discussed with interventional team as well as other colleagues.  I think it makes sense to pursue diagnostic coronary angiogram to make sure that there is no anomaly such as scad or embolic phenomenon etc.  My suspicion for true ACS/plaque rupture is low but needs to be negated in the setting of such a high troponin elevation.  The most probable diagnosis however is myocarditis or myopericarditis.  Thankfully his chest discomfort is minimal now.  If  coronary angiogram is normal, we will proceed with cardiac MRI.  C-reactive protein is mildly elevated.  Sed rate interestingly is 1.  Treatment would be ibuprofen  and colchicine .  He does not report any recent viral illnesses.  Could be alcohol related.  Awaiting echocardiogram as well.  Risks and benefits of cardiac catheterization have been discussed including stroke heart attack death renal impairment bleeding.  Willing to proceed.  Mother in room.  Oneil Parchment, MD

## 2024-02-26 NOTE — Discharge Instructions (Signed)
Radial Site Care Refer to this sheet in the next few weeks. These instructions provide you with information on caring for yourself after your procedure. Your caregiver may also give you more specific instructions. Your treatment has been planned according to current medical practices, but problems sometimes occur. Call your caregiver if you have any problems or questions after your procedure. HOME CARE INSTRUCTIONS  You may shower the day after the procedure.Remove the bandage (dressing) and gently wash the site with plain soap and water.Gently pat the site dry.   Do not apply powder or lotion to the site.   Do not submerge the affected site in water for 3 to 5 days.   Inspect the site at least twice daily.   Do not flex or bend the affected arm for 24 hours.   No lifting over 5 pounds (2.3 kg) for 5 days after your procedure.   Do not drive home if you are discharged the same day of the procedure. Have someone else drive you.   You may drive 24 hours after the procedure unless otherwise instructed by your caregiver.  What to expect:  Any bruising will usually fade within 1 to 2 weeks.   Blood that collects in the tissue (hematoma) may be painful to the touch. It should usually decrease in size and tenderness within 1 to 2 weeks.  SEEK IMMEDIATE MEDICAL CARE IF:  You have unusual pain at the radial site.   You have redness, warmth, swelling, or pain at the radial site.   You have drainage (other than a small amount of blood on the dressing).   You have chills.   You have a fever or persistent symptoms for more than 72 hours.   You have a fever and your symptoms suddenly get worse.   Your arm becomes pale, cool, tingly, or numb.   You have heavy bleeding from the site. Hold pressure on the site.

## 2024-02-26 NOTE — Progress Notes (Signed)
 PHARMACY - ANTICOAGULATION CONSULT NOTE  Pharmacy Consult for Heparin  Indication: chest pain/ACS  No Known Allergies  Patient Measurements: Height: 5' 10 (177.8 cm) Weight: 107.8 kg (237 lb 11.2 oz) IBW/kg (Calculated) : 73 HEPARIN  DW (KG): 95.2  Vital Signs: Temp: 97.8 F (36.6 C) (07/31 1050) Temp Source: Oral (07/31 1050) BP: 120/74 (07/31 1050) Pulse Rate: 72 (07/31 1050)  Labs: Recent Labs    02/25/24 1144 02/25/24 1953 02/25/24 2047 02/25/24 2357 02/26/24 0240 02/26/24 1011  HGB 16.0  --   --   --  15.5  --   HCT 47.4  --   --   --  46.5  --   PLT 175  --   --   --  171  --   APTT  --   --  31  --   --   --   LABPROT  --   --  14.0  --   --   --   INR  --   --  1.0  --   --   --   HEPARINUNFRC  --   --   --   --   --  0.24*  CREATININE 1.13  --   --   --   --   --   TROPONINIHS  --  8,509*  --  6,541* 6,621*  --     Estimated Creatinine Clearance: 126 mL/min (by C-G formula based on SCr of 1.13 mg/dL).   Medical History: Past Medical History:  Diagnosis Date   Tobacco use     Assessment: 23 year old male to begin heparin  for chest pain. No AC PTA. Trop came back elevated at 8509.   Heparin  level 0.24 is subtherapeutic with heparin  running at 1200 units/hr. Hgb (15.5) and PLTs (171) are stable. Patient renal function is stable. Per RN, no report of pauses, issues with the line, or signs of bleeding.    Goal of Therapy:  Heparin  level 0.3-0.7 units/ml Monitor platelets by anticoagulation protocol: Yes   Plan:  Increase heparin  to 1450 units/hr Recheck heparin  level after 6 hours to confirm therapeutic Monitor daily heparin  levels and CBC Monitor for any signs/symptoms of bleeding  Thank you for allowing pharmacy to be involved with this patient's care.  Mendel Barter, PharmD PGY1 Clinical Pharmacist [facility]  02/26/2024 11:54 AM

## 2024-02-26 NOTE — H&P (View-Only) (Signed)
 Rounding Note   Patient Name: Scott Gardner Date of Encounter: 02/26/2024  [facility] Cardiologist: Soyla DELENA Merck, MD   Subjective Currently asymptomatic.   Patient shared that his alcohol use has been high this month given family birthdays, however prior to the month of July his alcohol use was approximately 5 beverages a week.   Events leading up to this admission, patient went to [place] with his friends for a birthday.  They rented an air B&B for the weekend, he does endorse heavy drinking this weekend.  He denies any trauma to his chest over the weekend.  He denies other drug use besides vaping.  None of his friends to his knowledge were sick, he denies systemic illness symptoms.  Notes that he did wake up 1 night drenched in sweat no other symptoms.  He does occasionally get palpitations.  No history of syncope.   Patient described the chest pain as a tightness with radiation to his neck.  No other associated symptoms.  His first episode of chest pain went away after he took a nap.  Second chest pain patient endorsed he was speaking with his mother and became anxious, chest pain then did not go away and became concerned and came to the ED. Again no other symptoms during his chest pain episode.   Asked about his family history of cardiac disease, patient shared that his paternal side does have cardiac disease though does have a history of drug use.  His sister was at bedside and shared their father did have a history of high blood pressure and syncope with exertion, however was actively using drugs.  No information on maternal side.   Scheduled Meds:  aspirin  EC  81 mg Oral Daily   folic acid   1 mg Oral Daily   multivitamin with minerals  1 tablet Oral Daily   sodium chloride  flush  3 mL Intravenous Q12H   sodium chloride  flush  3 mL Intravenous Q12H   thiamine   100 mg Oral Daily   Continuous Infusions:  sodium chloride      heparin  1,200 Units/hr  (02/25/24 2224)   PRN Meds: sodium chloride , acetaminophen  **OR** acetaminophen , ondansetron  **OR** ondansetron  (ZOFRAN ) IV, sodium chloride  flush   Vital Signs  Vitals:   02/25/24 1954 02/25/24 2200 02/26/24 0039 02/26/24 0421  BP: 118/65  (!) 107/58 110/65  Pulse: 71  61 65  Resp:  18 20 18   Temp: 97.6 F (36.4 C)  98.6 F (37 C) 97.9 F (36.6 C)  TempSrc:   Oral Oral  SpO2: 99%  99% 98%  Weight:    107.8 kg  Height:        Intake/Output Summary (Last 24 hours) at 02/26/2024 0731 Last data filed at 02/26/2024 0700 Gross per 24 hour  Intake 140.63 ml  Output 400 ml  Net -259.37 ml      02/26/2024    4:21 AM 02/25/2024    7:43 PM 02/25/2024   11:39 AM  Last 3 Weights  Weight (lbs) 237 lb 11.2 oz 240 lb 4.8 oz 230 lb  Weight (kg) 107.82 kg 108.999 kg 104.327 kg      Telemetry Sinus rhythm heart rate 50-75- Personally Reviewed  ECG  Sinus rhythm with TWI in III, diffuse ST elevations VR 70 - Personally Reviewed   Physical Exam GEN: No acute distress.   Neck: No carotid bruits Cardiac: RRR, no murmurs, rubs, or gallops.  No tenderness to chest wall with palpation Respiratory: Clear to auscultation bilaterally.  GI: Soft, nontender, non-distended  MS: No edema; No deformity. Neuro:  Nonfocal  Psych: Normal affect   Labs High Sensitivity Troponin:   Recent Labs  Lab 02/25/24 1953 02/25/24 2357 02/26/24 0240  TROPONINIHS 8,509* 6,541* 6,621*     Chemistry Recent Labs  Lab 02/25/24 1144  NA 141  K 4.3  CL 105  CO2 28  GLUCOSE 119*  BUN 9  CREATININE 1.13  CALCIUM  9.5  GFRNONAA >60  ANIONGAP 8    Lipids  Recent Labs  Lab 02/26/24 0240  CHOL 102  TRIG 68  HDL 39*  LDLCALC 49  CHOLHDL 2.6    Hematology Recent Labs  Lab 02/25/24 1144 02/26/24 0240  WBC 7.7 10.9*  RBC 5.25 5.12  HGB 16.0 15.5  HCT 47.4 46.5  MCV 90.3 90.8  MCH 30.5 30.3  MCHC 33.8 33.3  RDW 12.5 12.4  PLT 175 171   Thyroid  Recent Labs  Lab 02/25/24 1953  TSH  1.427    BNPNo results for input(s): BNP, PROBNP in the last 168 hours.  DDimer No results for input(s): DDIMER in the last 168 hours.   Radiology  CT Angio Chest Aorta W and/or Wo Contrast Result Date: 02/25/2024 CLINICAL DATA:  Acute aortic syndrome (AAS) suspected. Chest pain. Constant. EXAM: CT ANGIOGRAPHY CHEST WITH CONTRAST TECHNIQUE: Multidetector CT imaging of the chest was performed using the standard protocol during bolus administration of intravenous contrast. Multiplanar CT image reconstructions and MIPs were obtained to evaluate the vascular anatomy. RADIATION DOSE REDUCTION: This exam was performed according to the departmental dose-optimization program which includes automated exposure control, adjustment of the mA and/or kV according to patient size and/or use of iterative reconstruction technique. CONTRAST:  OMNIPAQUE  IOHEXOL  350 MG/ML SOLN COMPARISON:  None Available. FINDINGS: Cardiovascular: Evaluation is limited due to patient's motion during data acquisition. There is significant motion especially at the level of aortic arch/main pulmonary trunk region. Preferential opacification of the thoracic aorta. No evidence of thoracic aortic aneurysm, dissection, penetrating atherosclerotic ulcer or vasculitis. Normal heart size. No pericardial effusion. There is also satisfactory opacification of pulmonary arteries. No evidence of embolism to the proximal subsegmental pulmonary artery level. No aortic aneurysm. Mediastinum/Nodes: Visualized thyroid gland appears grossly unremarkable. No solid / cystic mediastinal masses. The esophagus is nondistended precluding optimal assessment. No axillary, mediastinal or hilar lymphadenopathy by size criteria. Lungs/Pleura: The central tracheo-bronchial tree is patent. No mass or consolidation. No pleural effusion or pneumothorax. No suspicious lung nodules. Upper Abdomen: There is an approximately 1.3 x 1.5 cm simple cyst in the left kidney  upper pole, posteriorly. Remaining visualized upper abdominal viscera within normal limits. Musculoskeletal: The visualized soft tissues of the chest wall are grossly unremarkable. No suspicious osseous lesions. Review of the MIP images confirms the above findings. IMPRESSION: 1. Evaluation is limited due to patient's motion during data acquisition. No evidence of thoracic aortic aneurysm, dissection or vasculitis. No evidence of pulmonary embolism to the proximal subsegmental pulmonary artery level. 2. No lung mass, consolidation, pleural effusion or pneumothorax. 3. Essentially unremarkable exam, as described above. Electronically Signed   By: Ree Molt M.D.   On: 02/25/2024 14:37   DG Chest 2 View Result Date: 02/25/2024 CLINICAL DATA:  Chest pain. EXAM: CHEST - 2 VIEW COMPARISON:  Chest radiograph dated 12/25/2015. FINDINGS: No focal consolidation, pleural effusion, pneumothorax. The cardiac silhouette is within normal limits. No acute osseous pathology. IMPRESSION: No active cardiopulmonary disease. Electronically Signed   By: Vanetta Shelia HERO.D.  On: 02/25/2024 12:11   Patient Profile   23 y.o. male with a past medical history of EOTH and tobacco use who presented to the ED on 7/30 for atypical chest pain. Hs troponin I 8509 -> 6541 -> 6621.  EKG shows no ischemic changes .  He was loaded on aspirin , and overnight started on heparin  drip.  Assessment & Plan  Atypical chest pain NSTEMI No prior cardiac history. On my interview chest pain is described as tightness with radiation to neck.  No other associated symptoms.  Patient reported chest pain was not relieved with rest.  CP is not associated with position change. He denies any trauma to his chest wall. Patient does endorse heavy drinking the weekend prior, confirms using [facility] periodically, denied other drug use.  He does vape. On chart review physical exam noted tenderness to chest wall with palpation, though not noted on my physical  exam.  ECG shows signs of possible pericarditis, CRP 1.6 CTA negative for PE and dissection, when asked about the significant motion on the CT patient stated he was confused about the directions denied claustrophobia.  Echocardiogram pending  Etiology more likely to be related to perimyocarditis, though an ischemic evaluation is warranted given the level of hs troponin elevation, will pursue cardiac catheterization. If this is unrevealing will pursue cMR to rule out myocarditis. Patient denied recent illness or vaccine. He was this previously weekend in [place] with friends sharing vapes so cannout completely rule out viral etiology.  Patient to remain NPO except free water  hydration for procedure today.  Continue ASA 81mg  Continue heparin  gtt, if SCAD will need to discontinue   Lipid panel  LDL 49    HDL 39 Will defer statin therapy for now, will wait for cath results   For questions or updates, please contact [facility] Please consult [email or web address] for contact info under     Signed, Leontine LOISE Salen, PA-C  02/26/2024, 7:31 AM    Personally seen and examined. Agree with above.  23 year old male with troponin now of 8000, J-point elevation fairly diffusely on ECG possible repolarization abnormality with admission with severe chest pain following large consumption of alcohol and cannonball into pool of 3 feet which injured his knee.  Had severe chest discomfort after this 10/10, also after intense discussion with his mother.  Case discussed with interventional team as well as other colleagues.  I think it makes sense to pursue diagnostic coronary angiogram to make sure that there is no anomaly such as scad or embolic phenomenon etc.  My suspicion for true ACS/plaque rupture is low but needs to be negated in the setting of such a high troponin elevation.  The most probable diagnosis however is myocarditis or myopericarditis.  Thankfully his chest discomfort is minimal now.  If  coronary angiogram is normal, we will proceed with cardiac MRI.  C-reactive protein is mildly elevated.  Sed rate interestingly is 1.  Treatment would be ibuprofen  and colchicine .  He does not report any recent viral illnesses.  Could be alcohol related.  Awaiting echocardiogram as well.  Risks and benefits of cardiac catheterization have been discussed including stroke heart attack death renal impairment bleeding.  Willing to proceed.  Mother in room.  Oneil Parchment, MD

## 2024-02-26 NOTE — Progress Notes (Signed)
 Cardiac catheterization reassuring with normal coronary arteries.  We will go ahead and start colchicine  0.6 mg BID, plan to take for three months. Also start ibuprofen  600 mg TID for 10 days. Anti-inflammatory regimen for myopericarditis.

## 2024-02-26 NOTE — Progress Notes (Signed)
 [facility] PROGRESS NOTE   Scott Gardner FMW:[id number] DOB: November 13, 2000 DOA: 02/25/2024  PCP: Pcp, No  Brief History: 23 y.o. male with no significant past medical history except recent right-sided knee injury recently being seen in urgent care 2 days ago recommended RICE and conservative management presented to emergency department complaining of chest pain.  Patient was noted to have significant elevated troponin levels.  EKG was also noted to be abnormal.  Patient was hospitalized for further management.  Patient denies any recent viral illness but mentions that he has been under a lot of stress over the last few months.   Consultants: Cardiology  Procedures: Echocardiogram is pending.    Subjective/Interval History: Patient denies any chest pain this morning.  No shortness of breath.  States that he has been under a lot of stress lately.  Denies any viral illness recently.    Assessment/Plan:  Chest pain with significantly elevated troponin level Etiology is unclear in this 23 year old patient without any significant comorbidities. Cardiology consulted. Patient was started on IV heparin .  Patient was given aspirin . Await echocardiogram. Await further cardiology input.  Cardiac MRI is being considered. Inflammatory markers were checked and CRP was noted to be 1.6.  ESR was normal. LDL is 49. Urine drug screen positive for marijuana. Patient admitted to heavy alcohol use over the weekend. CT angiogram did not show any aneurysm dissection or PE.  Chronic alcohol use Patient counseled.  Check LFTs.  Right-sided knee pain Recent x-ray did not show any fracture or dislocation.  Obesity Estimated body mass index is 34.11 kg/m as calculated from the following:   Height as of this encounter: 5' 10 (1.778 m).   Weight as of this encounter: 107.8 kg.   DVT Prophylaxis: On IV heparin  Code Status: Full code Family Communication: Discussed with  patient Disposition Plan: Hopefully home when improved  Status is: Observation The patient will require care spanning > 2 midnights and should be moved to inpatient because: Elevated troponin levels requiring further cardiac workup      Medications: Scheduled:  aspirin  EC  81 mg Oral Daily   folic acid   1 mg Oral Daily   multivitamin with minerals  1 tablet Oral Daily   sodium chloride  flush  3 mL Intravenous Q12H   sodium chloride  flush  3 mL Intravenous Q12H   thiamine   100 mg Oral Daily   Continuous:  sodium chloride      heparin  1,200 Units/hr (02/25/24 2224)   PRN:sodium chloride , acetaminophen  **OR** acetaminophen , ondansetron  **OR** ondansetron  (ZOFRAN ) IV, sodium chloride  flush  Antibiotics: Anti-infectives (From admission, onward)    None       Objective:  Vital Signs  Vitals:   02/26/24 0039 02/26/24 0421 02/26/24 0735 02/26/24 1036  BP: (!) 107/58 110/65 102/65   Pulse: 61 65    Resp: 20 18 17    Temp: 98.6 F (37 C) 97.9 F (36.6 C) 97.9 F (36.6 C)   TempSrc: Oral Oral Oral   SpO2: 99% 98%    Weight:  107.8 kg  107.8 kg  Height:        Intake/Output Summary (Last 24 hours) at 02/26/2024 1055 Last data filed at 02/26/2024 0918 Gross per 24 hour  Intake 640.63 ml  Output 400 ml  Net 240.63 ml   Filed Weights   02/25/24 1943 02/26/24 0421 02/26/24 1036  Weight: 109 kg 107.8 kg 107.8 kg    General appearance: Awake alert.  In no distress Resp: Clear to auscultation bilaterally.  Normal effort Cardio: S1-S2 is normal regular.  No S3-S4.  No rubs murmurs or bruit GI: Abdomen is soft.  Nontender nondistended.  Bowel sounds are present normal.  No masses organomegaly Extremities: No edema.  Full range of motion of lower extremities. Neurologic: Alert and oriented x3.  No focal neurological deficits.    Lab Results:  Data Reviewed: I have personally reviewed following labs and reports of the imaging studies  CBC: Recent Labs  Lab  02/25/24 1144 02/26/24 0240  WBC 7.7 10.9*  HGB 16.0 15.5  HCT 47.4 46.5  MCV 90.3 90.8  PLT 175 171    Basic Metabolic Panel: Recent Labs  Lab 02/25/24 1144  NA 141  K 4.3  CL 105  CO2 28  GLUCOSE 119*  BUN 9  CREATININE 1.13  CALCIUM  9.5    GFR: Estimated Creatinine Clearance: 126 mL/min (by C-G formula based on SCr of 1.13 mg/dL).  Liver Function Tests: No results for input(s): AST, ALT, ALKPHOS, BILITOT, PROT, ALBUMIN in the last 168 hours.   Coagulation Profile: Recent Labs  Lab 02/25/24 2047  INR 1.0    Lipid Profile: Recent Labs    02/26/24 0240  CHOL 102  HDL 39*  LDLCALC 49  TRIG 68  CHOLHDL 2.6    Thyroid Function Tests: Recent Labs    02/25/24 1953  TSH 1.427     Radiology Studies: CT Angio Chest Aorta W and/or Wo Contrast Result Date: 02/25/2024 CLINICAL DATA:  Acute aortic syndrome (AAS) suspected. Chest pain. Constant. EXAM: CT ANGIOGRAPHY CHEST WITH CONTRAST TECHNIQUE: Multidetector CT imaging of the chest was performed using the standard protocol during bolus administration of intravenous contrast. Multiplanar CT image reconstructions and MIPs were obtained to evaluate the vascular anatomy. RADIATION DOSE REDUCTION: This exam was performed according to the departmental dose-optimization program which includes automated exposure control, adjustment of the mA and/or kV according to patient size and/or use of iterative reconstruction technique. CONTRAST:  OMNIPAQUE  IOHEXOL  350 MG/ML SOLN COMPARISON:  None Available. FINDINGS: Cardiovascular: Evaluation is limited due to patient's motion during data acquisition. There is significant motion especially at the level of aortic arch/main pulmonary trunk region. Preferential opacification of the thoracic aorta. No evidence of thoracic aortic aneurysm, dissection, penetrating atherosclerotic ulcer or vasculitis. Normal heart size. No pericardial effusion. There is also satisfactory  opacification of pulmonary arteries. No evidence of embolism to the proximal subsegmental pulmonary artery level. No aortic aneurysm. Mediastinum/Nodes: Visualized thyroid gland appears grossly unremarkable. No solid / cystic mediastinal masses. The esophagus is nondistended precluding optimal assessment. No axillary, mediastinal or hilar lymphadenopathy by size criteria. Lungs/Pleura: The central tracheo-bronchial tree is patent. No mass or consolidation. No pleural effusion or pneumothorax. No suspicious lung nodules. Upper Abdomen: There is an approximately 1.3 x 1.5 cm simple cyst in the left kidney upper pole, posteriorly. Remaining visualized upper abdominal viscera within normal limits. Musculoskeletal: The visualized soft tissues of the chest wall are grossly unremarkable. No suspicious osseous lesions. Review of the MIP images confirms the above findings. IMPRESSION: 1. Evaluation is limited due to patient's motion during data acquisition. No evidence of thoracic aortic aneurysm, dissection or vasculitis. No evidence of pulmonary embolism to the proximal subsegmental pulmonary artery level. 2. No lung mass, consolidation, pleural effusion or pneumothorax. 3. Essentially unremarkable exam, as described above. Electronically Signed   By: Ree Molt M.D.   On: 02/25/2024 14:37   DG Chest 2 View Result Date: 02/25/2024 CLINICAL DATA:  Chest pain. EXAM: CHEST -  2 VIEW COMPARISON:  Chest radiograph dated 12/25/2015. FINDINGS: No focal consolidation, pleural effusion, pneumothorax. The cardiac silhouette is within normal limits. No acute osseous pathology. IMPRESSION: No active cardiopulmonary disease. Electronically Signed   By: Vanetta Chou M.D.   On: 02/25/2024 12:11       LOS: 0 days   Braden Deloach Verdene  [facility] Pager on [email or web address]  02/26/2024, 10:55 AM

## 2024-02-26 NOTE — TOC CM/SW Note (Signed)
 Transition of Care [facility]) - Inpatient Brief Assessment   Patient Details  Name: Scott Gardner MRN: [id number] Date of Birth: 2001-07-01  Transition of Care [facility]) CM/SW Contact:    Waddell Barnie Rama, RN Phone Number: 02/26/2024, 11:07 AM   Clinical Narrative: From home with mom, has no  PCP and has  insurance on file, states has no HH services in place at this time or DME at home.  States family member (mom or sister)  will transport them home at [employer] and family is support system, states gets medications from [facility] on [address]  Pta self ambulatory.   There are no  IP CM needs identified  at this time.  Please place consult for IP CM needs.     Transition of Care Asessment: Insurance and Status: Insurance coverage has been reviewed Patient has primary care physician: Yes Home environment has been reviewed: home with mom Prior level of function:: indep Prior/Current Home Services: No current home services Social Drivers of Health Review: SDOH reviewed no interventions necessary Readmission risk has been reviewed: Yes Transition of care needs: no transition of care needs at this time

## 2024-02-27 ENCOUNTER — Other Ambulatory Visit (HOSPITAL_COMMUNITY): Payer: Self-pay

## 2024-02-27 ENCOUNTER — Other Ambulatory Visit: Payer: Self-pay

## 2024-02-27 ENCOUNTER — Inpatient Hospital Stay (HOSPITAL_COMMUNITY)

## 2024-02-27 DIAGNOSIS — I214 Non-ST elevation (NSTEMI) myocardial infarction: Secondary | ICD-10-CM

## 2024-02-27 DIAGNOSIS — I409 Acute myocarditis, unspecified: Secondary | ICD-10-CM | POA: Diagnosis not present

## 2024-02-27 DIAGNOSIS — I401 Isolated myocarditis: Secondary | ICD-10-CM

## 2024-02-27 LAB — COMPREHENSIVE METABOLIC PANEL WITH GFR
ALT: 22 U/L (ref 0–44)
AST: 33 U/L (ref 15–41)
Albumin: 3.2 g/dL — ABNORMAL LOW (ref 3.5–5.0)
Alkaline Phosphatase: 53 U/L (ref 38–126)
Anion gap: 8 (ref 5–15)
BUN: 15 mg/dL (ref 6–20)
CO2: 23 mmol/L (ref 22–32)
Calcium: 8.7 mg/dL — ABNORMAL LOW (ref 8.9–10.3)
Chloride: 108 mmol/L (ref 98–111)
Creatinine, Ser: 1.22 mg/dL (ref 0.61–1.24)
GFR, Estimated: >60 mL/min (ref >60)
Glucose, Bld: 99 mg/dL (ref 70–99)
Potassium: 4 mmol/L (ref 3.5–5.1)
Sodium: 139 mmol/L (ref 135–145)
Total Bilirubin: 0.6 mg/dL (ref 0.0–1.2)
Total Protein: 5.5 g/dL — ABNORMAL LOW (ref 6.5–8.1)

## 2024-02-27 LAB — CBC
HCT: 46.4 % (ref 39.0–52.0)
Hemoglobin: 16.1 g/dL (ref 13.0–17.0)
MCH: 31 pg (ref 26.0–34.0)
MCHC: 34.7 g/dL (ref 30.0–36.0)
MCV: 89.2 fL (ref 80.0–100.0)
Platelets: 180 K/uL (ref 150–400)
RBC: 5.2 MIL/uL (ref 4.22–5.81)
RDW: 12.1 % (ref 11.5–15.5)
WBC: 11.1 K/uL — ABNORMAL HIGH (ref 4.0–10.5)
nRBC: 0 % (ref 0.0–0.2)

## 2024-02-27 LAB — MAGNESIUM: Magnesium: 2.3 mg/dL (ref 1.7–2.4)

## 2024-02-27 MED ORDER — COLCHICINE 0.6 MG PO TABS
0.6000 mg | ORAL_TABLET | Freq: Two times a day (BID) | ORAL | 0 refills | Status: AC
Start: 2024-02-27 — End: ?
  Filled 2024-02-27: qty 60, 30d supply, fill #0

## 2024-02-27 MED ORDER — VITAMIN B-1 100 MG PO TABS
100.0000 mg | ORAL_TABLET | Freq: Every day | ORAL | 0 refills | Status: AC
  Filled 2024-02-27: qty 30, 30d supply, fill #0

## 2024-02-27 MED ORDER — IBUPROFEN 600 MG PO TABS
600.0000 mg | ORAL_TABLET | Freq: Three times a day (TID) | ORAL | 0 refills | Status: DC
  Filled 2024-02-27: qty 90, 30d supply, fill #0

## 2024-02-27 MED ORDER — GADOBUTROL 1 MMOL/ML IV SOLN
10.0000 mL | Freq: Once | INTRAVENOUS | Status: AC | PRN
  Administered 2024-02-27: 10 mL via INTRAVENOUS

## 2024-02-27 MED ORDER — PANTOPRAZOLE SODIUM 40 MG PO TBEC
40.0000 mg | DELAYED_RELEASE_TABLET | Freq: Every day | ORAL | 0 refills | Status: AC
  Filled 2024-02-27: qty 30, 30d supply, fill #0

## 2024-02-27 MED ORDER — FOLIC ACID 1 MG PO TABS
1.0000 mg | ORAL_TABLET | Freq: Every day | ORAL | 0 refills | Status: AC
  Filled 2024-02-27: qty 30, 30d supply, fill #0

## 2024-02-27 NOTE — Plan of Care (Signed)
  Problem: Education: Goal: Knowledge of General Education information will improve Description: Including pain rating scale, medication(s)/side effects and non-pharmacologic comfort measures Outcome: Progressing   Problem: Health Behavior/Discharge Planning: Goal: Ability to manage health-related needs will improve Outcome: Progressing   Problem: Clinical Measurements: Goal: Ability to maintain clinical measurements within normal limits will improve Outcome: Progressing   Problem: Clinical Measurements: Goal: Will remain free from infection Outcome: Progressing   Problem: Clinical Measurements: Goal: Diagnostic test results will improve Outcome: Progressing   Problem: Clinical Measurements: Goal: Respiratory complications will improve Outcome: Progressing   Problem: Clinical Measurements: Goal: Cardiovascular complication will be avoided Outcome: Progressing   Problem: Activity: Goal: Risk for activity intolerance will decrease Outcome: Progressing   Problem: Nutrition: Goal: Adequate nutrition will be maintained Outcome: Progressing   Problem: Coping: Goal: Level of anxiety will decrease Outcome: Progressing   Problem: Coping: Goal: Level of anxiety will decrease Outcome: Progressing   Problem: Elimination: Goal: Will not experience complications related to urinary retention Outcome: Progressing   Problem: Pain Managment: Goal: General experience of comfort will improve and/or be controlled Outcome: Progressing   Problem: Skin Integrity: Goal: Risk for impaired skin integrity will decrease Outcome: Progressing   Problem: Education: Goal: Understanding of CV disease, CV risk reduction, and recovery process will improve Outcome: Progressing   Problem: Cardiovascular: Goal: Vascular access site(s) Level 0-1 will be maintained Outcome: Progressing   Problem: Activity: Goal: Ability to return to baseline activity level will improve Outcome:  Progressing

## 2024-02-27 NOTE — Progress Notes (Addendum)
 Rounding Note   Patient Name: Scott Gardner Date of Encounter: 02/27/2024  [facility] Cardiologist: Soyla DELENA Merck, MD   Subjective Doing well today. He had no reoccurrence of chest pain since yesterday afternoon.   Scheduled Meds:  colchicine   0.6 mg Oral BID   folic acid   1 mg Oral Daily   ibuprofen   600 mg Oral TID   multivitamin with minerals  1 tablet Oral Daily   pantoprazole   40 mg Oral Daily   sodium chloride  flush  3 mL Intravenous Q12H   sodium chloride  flush  3 mL Intravenous Q12H   sodium chloride  flush  3 mL Intravenous Q12H   thiamine   100 mg Oral Daily   Continuous Infusions:  sodium chloride      PRN Meds: sodium chloride , acetaminophen  **OR** acetaminophen , nitroGLYCERIN , ondansetron  **OR** ondansetron  (ZOFRAN ) IV, sodium chloride  flush, sodium chloride  flush   Vital Signs  Vitals:   02/26/24 2353 02/27/24 0415 02/27/24 0537 02/27/24 0611  BP: 109/71 108/67  (!) 117/59  Pulse: 81 83  73  Resp: 16 20  20   Temp: 98 F (36.7 C) 97.8 F (36.6 C)  97.7 F (36.5 C)  TempSrc: Oral Oral  Oral  SpO2: 95% 97%  100%  Weight:   107.2 kg   Height:        Intake/Output Summary (Last 24 hours) at 02/27/2024 0739 Last data filed at 02/26/2024 1535 Gross per 24 hour  Intake 1000 ml  Output --  Net 1000 ml      02/27/2024    5:37 AM 02/26/2024   10:36 AM 02/26/2024    4:21 AM  Last 3 Weights  Weight (lbs) 236 lb 5.3 oz 237 lb 11.2 oz 237 lb 11.2 oz  Weight (kg) 107.2 kg 107.82 kg 107.82 kg      Telemetry Sinus rhythm HR variable 60-120, avg 80 - Personally Reviewed  Physical Exam GEN: No acute distress.   Neck: No carotid bruit Cardiac: RRR, no murmurs, rubs, or gallops. R radial site stable. Radial pulses bilaterally 2 + Respiratory: Clear to auscultation bilaterally. GI: Soft, nontender, non-distended  MS: No edema; No deformity. Neuro:  Nonfocal  Psych: Normal affect   Labs High Sensitivity Troponin:   Recent Labs  Lab  02/26/24 0240 02/26/24 1011 02/26/24 1146 02/26/24 1634 02/26/24 1945  TROPONINIHS 6,621* 4,625* 4,219* 5,665* 5,731*     Chemistry Recent Labs  Lab 02/25/24 1144 02/26/24 1010 02/27/24 0255  NA 141 136 139  K 4.3 3.8 4.0  CL 105 106 108  CO2 28 23 23   GLUCOSE 119* 92 99  BUN 9 9 15   CREATININE 1.13 1.01 1.22  CALCIUM  9.5 8.6* 8.7*  MG  --   --  2.3  PROT  --  6.2* 5.5*  ALBUMIN  --  3.5 3.2*  AST  --  38 33  ALT  --  21 22  ALKPHOS  --  48 53  BILITOT  --  0.7 0.6  GFRNONAA >60 >60 >60  ANIONGAP 8 7 8     Lipids  Recent Labs  Lab 02/26/24 0240  CHOL 102  TRIG 68  HDL 39*  LDLCALC 49  CHOLHDL 2.6    Hematology Recent Labs  Lab 02/25/24 1144 02/26/24 0240 02/27/24 0255  WBC 7.7 10.9* 11.1*  RBC 5.25 5.12 5.20  HGB 16.0 15.5 16.1  HCT 47.4 46.5 46.4  MCV 90.3 90.8 89.2  MCH 30.5 30.3 31.0  MCHC 33.8 33.3 34.7  RDW 12.5  12.4 12.1  PLT 175 171 180   Thyroid  Recent Labs  Lab 02/25/24 1953  TSH 1.427    BNPNo results for input(s): BNP, PROBNP in the last 168 hours.  DDimer No results for input(s): DDIMER in the last 168 hours.   Radiology  ECHOCARDIOGRAM COMPLETE Result Date: 02/26/2024    ECHOCARDIOGRAM REPORT   Patient Name:   Scott Gardner Date of Exam: 02/26/2024 Medical Rec #:  [id number]            Height:       70.0 in Accession #:    [id number]           Weight:       237.7 lb Date of Birth:  2000/10/13            BSA:          2.246 m Patient Age:    23 years             BP:           120/74 mmHg Patient Gender: M                    HR:           60 bpm. Exam Location:  Inpatient Procedure: 2D Echo, Color Doppler and Cardiac Doppler (Both Spectral and Color            Flow Doppler were utilized during procedure). Indications:    Elevated Troponins  History:        Patient has no prior history of Echocardiogram examinations.                 Risk Factors:ETOH.  Sonographer:    Damien Senior RDCS Referring Phys: [phone] DAYNA N DUNN  IMPRESSIONS  1. Left ventricular ejection fraction, by estimation, is 50 to 55%. The left ventricle has low normal function. The left ventricle has no regional wall motion abnormalities. Left ventricular diastolic parameters were normal.  2. Right ventricular systolic function is normal. The right ventricular size is normal. Tricuspid regurgitation signal is inadequate for assessing PA pressure.  3. The mitral valve is normal in structure. Trivial mitral valve regurgitation. No evidence of mitral stenosis.  4. The aortic valve is tricuspid. Aortic valve regurgitation is not visualized. No aortic stenosis is present.  5. The inferior vena cava is normal in size with greater than 50% respiratory variability, suggesting right atrial pressure of 3 mmHg. FINDINGS  Left Ventricle: Left ventricular ejection fraction, by estimation, is 50 to 55%. The left ventricle has low normal function. The left ventricle has no regional wall motion abnormalities. The left ventricular internal cavity size was normal in size. There is no left ventricular hypertrophy. Left ventricular diastolic parameters were normal. Right Ventricle: The right ventricular size is normal. No increase in right ventricular wall thickness. Right ventricular systolic function is normal. Tricuspid regurgitation signal is inadequate for assessing PA pressure. Left Atrium: Left atrial size was normal in size. Right Atrium: Right atrial size was normal in size. Pericardium: There is no evidence of pericardial effusion. Mitral Valve: The mitral valve is normal in structure. Trivial mitral valve regurgitation. No evidence of mitral valve stenosis. Tricuspid Valve: The tricuspid valve is normal in structure. Tricuspid valve regurgitation is trivial. No evidence of tricuspid stenosis. Aortic Valve: The aortic valve is tricuspid. Aortic valve regurgitation is not visualized. No aortic stenosis is present. Pulmonic Valve: The pulmonic valve was normal in structure.  Pulmonic valve  regurgitation is trivial. No evidence of pulmonic stenosis. Aorta: The aortic root is normal in size and structure. Venous: The inferior vena cava is normal in size with greater than 50% respiratory variability, suggesting right atrial pressure of 3 mmHg. IAS/Shunts: No atrial level shunt detected by color flow Doppler.  LEFT VENTRICLE PLAX 2D LVIDd:         4.70 cm   Diastology LVIDs:         3.30 cm   LV e' medial:    16.80 cm/s LV PW:         0.80 cm   LV E/e' medial:  6.2 LV IVS:        0.80 cm   LV e' lateral:   16.80 cm/s LVOT diam:     2.10 cm   LV E/e' lateral: 6.2 LV SV:         53 LV SV Index:   24 LVOT Area:     3.46 cm  RIGHT VENTRICLE RV S prime:     14.30 cm/s TAPSE (M-mode): 2.3 cm LEFT ATRIUM             Index        RIGHT ATRIUM           Index LA diam:        3.40 cm 1.51 cm/m   RA Area:     17.50 cm LA Vol (A2C):   52.9 ml 23.55 ml/m  RA Volume:   48.60 ml  21.64 ml/m LA Vol (A4C):   43.6 ml 19.41 ml/m LA Biplane Vol: 50.8 ml 22.62 ml/m  AORTIC VALVE LVOT Vmax:   84.60 cm/s LVOT Vmean:  58.600 cm/s LVOT VTI:    0.154 m  AORTA Ao Root diam: 2.80 cm Ao Asc diam:  2.80 cm MITRAL VALVE MV Area (PHT): 3.17 cm     SHUNTS MV Decel Time: 239 msec     Systemic VTI:  0.15 m MV E velocity: 105.00 cm/s  Systemic Diam: 2.10 cm MV A velocity: 47.20 cm/s MV E/A ratio:  2.22 Soyla Merck MD Electronically signed by Soyla Merck MD Signature Date/Time: 02/26/2024/7:05:31 PM    Final    CARDIAC CATHETERIZATION Result Date: 02/26/2024 No angiographic evidence of CAD LVEDP 18 mmHg. Recommendations: No further ischemic workup.   CT Angio Chest Aorta W and/or Wo Contrast Result Date: 02/25/2024 CLINICAL DATA:  Acute aortic syndrome (AAS) suspected. Chest pain. Constant. EXAM: CT ANGIOGRAPHY CHEST WITH CONTRAST TECHNIQUE: Multidetector CT imaging of the chest was performed using the standard protocol during bolus administration of intravenous contrast. Multiplanar CT image  reconstructions and MIPs were obtained to evaluate the vascular anatomy. RADIATION DOSE REDUCTION: This exam was performed according to the departmental dose-optimization program which includes automated exposure control, adjustment of the mA and/or kV according to patient size and/or use of iterative reconstruction technique. CONTRAST:  OMNIPAQUE  IOHEXOL  350 MG/ML SOLN COMPARISON:  None Available. FINDINGS: Cardiovascular: Evaluation is limited due to patient's motion during data acquisition. There is significant motion especially at the level of aortic arch/main pulmonary trunk region. Preferential opacification of the thoracic aorta. No evidence of thoracic aortic aneurysm, dissection, penetrating atherosclerotic ulcer or vasculitis. Normal heart size. No pericardial effusion. There is also satisfactory opacification of pulmonary arteries. No evidence of embolism to the proximal subsegmental pulmonary artery level. No aortic aneurysm. Mediastinum/Nodes: Visualized thyroid gland appears grossly unremarkable. No solid / cystic mediastinal masses. The esophagus is nondistended precluding optimal assessment. No axillary, mediastinal or  hilar lymphadenopathy by size criteria. Lungs/Pleura: The central tracheo-bronchial tree is patent. No mass or consolidation. No pleural effusion or pneumothorax. No suspicious lung nodules. Upper Abdomen: There is an approximately 1.3 x 1.5 cm simple cyst in the left kidney upper pole, posteriorly. Remaining visualized upper abdominal viscera within normal limits. Musculoskeletal: The visualized soft tissues of the chest wall are grossly unremarkable. No suspicious osseous lesions. Review of the MIP images confirms the above findings. IMPRESSION: 1. Evaluation is limited due to patient's motion during data acquisition. No evidence of thoracic aortic aneurysm, dissection or vasculitis. No evidence of pulmonary embolism to the proximal subsegmental pulmonary artery level. 2. No  lung mass, consolidation, pleural effusion or pneumothorax. 3. Essentially unremarkable exam, as described above. Electronically Signed   By: Ree Molt M.D.   On: 02/25/2024 14:37   DG Chest 2 View Result Date: 02/25/2024 CLINICAL DATA:  Chest pain. EXAM: CHEST - 2 VIEW COMPARISON:  Chest radiograph dated 12/25/2015. FINDINGS: No focal consolidation, pleural effusion, pneumothorax. The cardiac silhouette is within normal limits. No acute osseous pathology. IMPRESSION: No active cardiopulmonary disease. Electronically Signed   By: Vanetta Chou M.D.   On: 02/25/2024 12:11    Patient Profile   23 y.o. male with a past medical history of EOTH and tobacco use who presented to the ED on 7/30 for atypical chest pain. Hs troponin I 8509 -> 6541 -> 6621.  EKG shows no ischemic changes .  He was loaded on aspirin , and overnight started on heparin  drip.  Assessment & Plan  Atypical chest pain NSTEMI No prior cardiac history. Patient endorsed heavy drinking the weekend prior, confirms using [facility] periodically, denied other drug use.  He does vape and use nicotine pouches. ECG shows signs of possible pericarditis, CRP 1.6 CTA negative for PE and dissection Cardiac cath was pursued given degree of troponin elevation, this showed normal coronaries.  Echocardiogram showed LVEF 50-55% with no RWMA. No valvular disease. No evidence of pericardial effusion . Cardiac MR preformed this am to assess for perimyocarditis, results show acute myocarditis. Patient denied recent illness or vaccine. He was this previously weekend in [place] with friends sharing vapes so cannout completely rule out asymptomatic viral etiology, though his most likely etiology is alcohol induced.  Educated patient on alcohol cessation and to refrain from strenuous exercise during this acute period.   Continue ibuprofen  600 mg TID Continue colchicine  0.6 mg BID Continue protonix  40 mg   Will set up follow-up appointment with cardiology.     For questions or updates, please contact [facility] Please consult [email or web address] for contact info under     Signed, Leontine LOISE Salen, PA-C  02/27/2024, 7:39 AM    Personally seen and examined. Agree with above.  Doing much better with anti-inflammatory regimen.  Importance of taking these medications with food.  He will avoid vaping.  Will avoid heavy alcohol.  Okay for discharge.  Cardiac MRI reviewed.  Acute myocarditis.  Avoid heavy exercise on the initial discharge.  Please call with any concerns or questions.  Oneil Parchment, MD

## 2024-02-27 NOTE — Discharge Summary (Signed)
 [facility]  Physician Discharge Summary   Patient ID: Scott Gardner MRN: [id number] DOB/AGE: [birth year] 23 y.o.  Admit date: 02/25/2024 Discharge date: 02/27/2024    PCP: Pcp, No  DISCHARGE DIAGNOSES:  Acute myocarditis Chronic alcohol use Right-sided knee pain  RECOMMENDATIONS FOR OUTPATIENT FOLLOW UP: Cardiology to arrange outpatient follow-up  Home Health: None Equipment/Devices: None  CODE STATUS: Full code  DISCHARGE CONDITION: fair  Diet recommendation: As before  INITIAL HISTORY: 23 y.o. male with no significant past medical history except recent right-sided knee injury recently being seen in urgent care 2 days ago recommended RICE and conservative management presented to emergency department complaining of chest pain.  Patient was noted to have significant elevated troponin levels.  EKG was also noted to be abnormal.  Patient was hospitalized for further management.  Patient denies any recent viral illness but mentions that he has been under a lot of stress over the last few months.    Consultants: Cardiology   Procedures: Echocardiogram  HOSPITAL COURSE:   Acute myocarditis with significantly elevated troponin levels Patient presented with chest pain and was found to have elevated troponin levels.   CT angiogram did not show any aneurysm dissection or PE. Cardiology consulted. Patient was started on IV heparin .  Patient was given aspirin . Echocardiogram showed LVEF of 50 to 55%.  Patient underwent cardiac catheterization due to persistent chest pain with EKG changes.  Cardiac catheterization showed normal coronary arteries.   Concern was for pericarditis or myocarditis. Patient underwent cardiac MRI this morning.  This suggest myocarditis. Etiology is unclear but could be related to alcohol use. Inflammatory markers were checked and CRP was noted to be 1.6.  ESR was normal. LDL is 49. Urine drug screen positive for marijuana. Patient has been  started on colchicine  and ibuprofen  by cardiology.   Chronic alcohol use Patient counseled.  Teaser normal.   Right-sided knee pain Recent x-ray did not show any fracture or dislocation.   Obesity Estimated body mass index is 33.91 kg/m as calculated from the following:   Height as of this encounter: 5' 10 (1.778 m).   Weight as of this encounter: 107.2 kg.   Patient is stable.  Cleared by cardiology for discharge.  PERTINENT LABS:  The results of significant diagnostics from this hospitalization (including imaging, microbiology, ancillary and laboratory) are listed below for reference.     Labs:   Basic Metabolic Panel: Recent Labs  Lab 02/25/24 1144 02/26/24 1010 02/27/24 0255  NA 141 136 139  K 4.3 3.8 4.0  CL 105 106 108  CO2 28 23 23   GLUCOSE 119* 92 99  BUN 9 9 15   CREATININE 1.13 1.01 1.22  CALCIUM  9.5 8.6* 8.7*  MG  --   --  2.3   Liver Function Tests: Recent Labs  Lab 02/26/24 1010 02/27/24 0255  AST 38 33  ALT 21 22  ALKPHOS 48 53  BILITOT 0.7 0.6  PROT 6.2* 5.5*  ALBUMIN 3.5 3.2*   CBC: Recent Labs  Lab 02/25/24 1144 02/26/24 0240 02/27/24 0255  WBC 7.7 10.9* 11.1*  HGB 16.0 15.5 16.1  HCT 47.4 46.5 46.4  MCV 90.3 90.8 89.2  PLT 175 171 180     IMAGING STUDIES MR CARDIAC MORPHOLOGY W WO CONTRAST Result Date: 02/27/2024 CLINICAL DATA:  Myocarditis suspected COMPARISON: Echo 02/26/24 EXAM: MR CARDIA MORPHOLOGY WITHOUT AND WITH CONTRAST; MR CARDIAC VELOCITY FLOW MAPPING TECHNIQUE: The patient was scanned on a 1.5 Tesla Siemens magnet. A dedicated cardiac coil was  used. Functional imaging was done using TrueFisp sequences. 2,3, and 4 chamber views were done to assess for RWMA's. Modified Simpson's rule using a short axis stack was used to calculate an ejection fraction on a dedicated work [employer]. The patient received 10mL GADAVIST  GADOBUTROL  1 MMOL/ML IV SOLN. After 10 minutes inversion recovery sequences were used to  assess for infiltration and scar tissue. Phase contrast velocity encoded images obtained x 2. This examination is tailored for evaluation cardiac anatomy and function and provides very limited assessment of noncardiac structures, which are accordingly not evaluated during interpretation. If there is clinical concern for extracardiac pathology, further evaluation with CT imaging should be considered. FINDINGS: LEFT VENTRICLE: Left ventricular chamber size: Normal. Left ventricular wall thickness: Grossly normal. Left ventricular systolic function: Normal LVEF = 55% There are no regional wall motion abnormalities. Myocardial edema in the apical anteroseptum, T2 globally = 48 msec, however T2 time elevated in the inferoapex, T2 66 ms Normal first pass perfusion. There is post contrast delayed myocardial enhancement: LV basal-mid subepicardial inferior and inferolateral LGE. At the mid to apical inferior wall, LGE is dense subepicardial and midmyocardial, extending to the inferoseptum and inferolateral walls. There is also dense focal area of LGE in the apical anteroseptum, near transmural subepicardial-midmyocardial. Normal T1 myocardial nulling kinetics suggest against a diagnosis of cardiac amyloidosis. ECV = 26%, normal range RIGHT VENTRICLE: Normal right ventricular chamber size. Normal right ventricular wall thickness. Normal right ventricular systolic function. RVEF = 50% There are no regional wall motion abnormalities. No post contrast delayed myocardial enhancement. ATRIA: Normal PERICARDIUM: Normal pericardium. Trivial pericardial effusion. No definite pericardial delayed enhancement. OTHER: No significant extracardiac findings. MEASUREMENTS: Qp/Qs: 0.97 VALVES: Aortic valve regurgitation: Trace, regurgitant fraction <1% Pulmonary valve regurgitation: Trivial, regurgitant fraction 3% Mitral valve regurgitation: Mild, regurgitant fraction 7% Tricuspid valve regurgitation: Trivial, regurgitant fraction 5% Left  ventricle: LV male LV EF: 55% (normal 49-79%) Absolute volumes: LV EDV: (Normal 95-215 mL) LV ESV: 85mL (Normal 25-85 mL) LV SV: (Normal 61-145 mL) CO: 6.0L/min (Normal 3.4-7.8 L/min) Indexed volumes: CI: 2.6L/min/sq-m (Normal 1.8-4.2 L/min/sq-m) LV EDV: 87mL/sq-m (Normal 50-108 mL/sq-m) LV ESV: 62mL/sq-m (Normal 11-47 mL/sq-m) LV SV: 63mL/sq-m (Normal 33-72 mL/sq-m) Right ventricle: RV male RV EF:  50% (Normal 51-80%) Absolute volumes: RV EDV: (Normal 109-217 mL) RV ESV: (Normal 23-91 mL) RV SV: (Normal 71-141 mL) CO: 5.9L/min (Normal 2.8-8.8 L/min) Indexed volumes: CI: 2.57L/min/sq-m (Normal 1.7-4.2 L/min/sq-m) RV EDV: 14mL/sq-m (Normal 58-109 mL/sq-m) RV ESV: 68mL/sq-m (Normal 12-46 mL/sq-m) RV SV: 9mL/sq-m (Normal 38-71 mL/sq-m) IMPRESSION: 1. Findings most consistent with myocarditis. LV basal-mid subepicardial inferior and inferolateral delayed enhancement, with mid-apical inferior wall dense subepicardial and midmyocardial LGE, extending to the inferoseptum and inferolateral walls. There is also dense focal near transmural area of LGE in the apical anteroseptum. Associated myocardial edema in the anteroseptal apex and inferoapex. 2. Trivial pericardial effusion. No definite delayed pericardial enhancement. 3. Normal biventricular chamber size and function. LVEF 55%. RVEF 50%. 4.  No hemodynamically significant valvular heart disease. Electronically Signed   By: Soyla Merck M.D.   On: 02/27/2024 09:10   MR CARDIAC VELOCITY FLOW MAP Result Date: 02/27/2024 CLINICAL DATA:  Myocarditis suspected COMPARISON: Echo 02/26/24 EXAM: MR CARDIA MORPHOLOGY WITHOUT AND WITH CONTRAST; MR CARDIAC VELOCITY FLOW MAPPING TECHNIQUE: The patient was scanned on a 1.5 Tesla Siemens magnet. A dedicated cardiac coil was used. Functional imaging was done using TrueFisp sequences. 2,3, and 4 chamber views were done to assess  for RWMA's. Modified Simpson's rule using a short axis stack was used to  calculate an ejection fraction on a dedicated work [employer]. The patient received 10mL GADAVIST  GADOBUTROL  1 MMOL/ML IV SOLN. After 10 minutes inversion recovery sequences were used to assess for infiltration and scar tissue. Phase contrast velocity encoded images obtained x 2. This examination is tailored for evaluation cardiac anatomy and function and provides very limited assessment of noncardiac structures, which are accordingly not evaluated during interpretation. If there is clinical concern for extracardiac pathology, further evaluation with CT imaging should be considered. FINDINGS: LEFT VENTRICLE: Left ventricular chamber size: Normal. Left ventricular wall thickness: Grossly normal. Left ventricular systolic function: Normal LVEF = 55% There are no regional wall motion abnormalities. Myocardial edema in the apical anteroseptum, T2 globally = 48 msec, however T2 time elevated in the inferoapex, T2 66 ms Normal first pass perfusion. There is post contrast delayed myocardial enhancement: LV basal-mid subepicardial inferior and inferolateral LGE. At the mid to apical inferior wall, LGE is dense subepicardial and midmyocardial, extending to the inferoseptum and inferolateral walls. There is also dense focal area of LGE in the apical anteroseptum, near transmural subepicardial-midmyocardial. Normal T1 myocardial nulling kinetics suggest against a diagnosis of cardiac amyloidosis. ECV = 26%, normal range RIGHT VENTRICLE: Normal right ventricular chamber size. Normal right ventricular wall thickness. Normal right ventricular systolic function. RVEF = 50% There are no regional wall motion abnormalities. No post contrast delayed myocardial enhancement. ATRIA: Normal PERICARDIUM: Normal pericardium. Trivial pericardial effusion. No definite pericardial delayed enhancement. OTHER: No significant extracardiac findings. MEASUREMENTS: Qp/Qs: 0.97 VALVES: Aortic valve regurgitation: Trace, regurgitant  fraction <1% Pulmonary valve regurgitation: Trivial, regurgitant fraction 3% Mitral valve regurgitation: Mild, regurgitant fraction 7% Tricuspid valve regurgitation: Trivial, regurgitant fraction 5% Left ventricle: LV male LV EF: 55% (normal 49-79%) Absolute volumes: LV EDV: (Normal 95-215 mL) LV ESV: 85mL (Normal 25-85 mL) LV SV: (Normal 61-145 mL) CO: 6.0L/min (Normal 3.4-7.8 L/min) Indexed volumes: CI: 2.6L/min/sq-m (Normal 1.8-4.2 L/min/sq-m) LV EDV: 83mL/sq-m (Normal 50-108 mL/sq-m) LV ESV: 68mL/sq-m (Normal 11-47 mL/sq-m) LV SV: 8mL/sq-m (Normal 33-72 mL/sq-m) Right ventricle: RV male RV EF:  50% (Normal 51-80%) Absolute volumes: RV EDV: (Normal 109-217 mL) RV ESV: (Normal 23-91 mL) RV SV: (Normal 71-141 mL) CO: 5.9L/min (Normal 2.8-8.8 L/min) Indexed volumes: CI: 2.57L/min/sq-m (Normal 1.7-4.2 L/min/sq-m) RV EDV: 106mL/sq-m (Normal 58-109 mL/sq-m) RV ESV: 54mL/sq-m (Normal 12-46 mL/sq-m) RV SV: 41mL/sq-m (Normal 38-71 mL/sq-m) IMPRESSION: 1. Findings most consistent with myocarditis. LV basal-mid subepicardial inferior and inferolateral delayed enhancement, with mid-apical inferior wall dense subepicardial and midmyocardial LGE, extending to the inferoseptum and inferolateral walls. There is also dense focal near transmural area of LGE in the apical anteroseptum. Associated myocardial edema in the anteroseptal apex and inferoapex. 2. Trivial pericardial effusion. No definite delayed pericardial enhancement. 3. Normal biventricular chamber size and function. LVEF 55%. RVEF 50%. 4.  No hemodynamically significant valvular heart disease. Electronically Signed   By: Soyla Merck M.D.   On: 02/27/2024 09:10   MR CARDIAC VELOCITY FLOW MAP Result Date: 02/27/2024 CLINICAL DATA:  Myocarditis suspected COMPARISON: Echo 02/26/24 EXAM: MR CARDIA MORPHOLOGY WITHOUT AND WITH CONTRAST; MR CARDIAC VELOCITY FLOW MAPPING TECHNIQUE: The patient was scanned on a 1.5 Tesla Siemens magnet. A  dedicated cardiac coil was used. Functional imaging was done using TrueFisp sequences. 2,3, and 4 chamber views were done to assess for RWMA's. Modified Simpson's rule using a short axis stack was used to calculate an ejection  fraction on a dedicated work [employer], [employer]. The patient received 10mL GADAVIST  GADOBUTROL  1 MMOL/ML IV SOLN. After 10 minutes inversion recovery sequences were used to assess for infiltration and scar tissue. Phase contrast velocity encoded images obtained x 2. This examination is tailored for evaluation cardiac anatomy and function and provides very limited assessment of noncardiac structures, which are accordingly not evaluated during interpretation. If there is clinical concern for extracardiac pathology, further evaluation with CT imaging should be considered. FINDINGS: LEFT VENTRICLE: Left ventricular chamber size: Normal. Left ventricular wall thickness: Grossly normal. Left ventricular systolic function: Normal LVEF = 55% There are no regional wall motion abnormalities. Myocardial edema in the apical anteroseptum, T2 globally = 48 msec, however T2 time elevated in the inferoapex, T2 66 ms Normal first pass perfusion. There is post contrast delayed myocardial enhancement: LV basal-mid subepicardial inferior and inferolateral LGE. At the mid to apical inferior wall, LGE is dense subepicardial and midmyocardial, extending to the inferoseptum and inferolateral walls. There is also dense focal area of LGE in the apical anteroseptum, near transmural subepicardial-midmyocardial. Normal T1 myocardial nulling kinetics suggest against a diagnosis of cardiac amyloidosis. ECV = 26%, normal range RIGHT VENTRICLE: Normal right ventricular chamber size. Normal right ventricular wall thickness. Normal right ventricular systolic function. RVEF = 50% There are no regional wall motion abnormalities. No post contrast delayed myocardial enhancement. ATRIA: Normal PERICARDIUM: Normal  pericardium. Trivial pericardial effusion. No definite pericardial delayed enhancement. OTHER: No significant extracardiac findings. MEASUREMENTS: Qp/Qs: 0.97 VALVES: Aortic valve regurgitation: Trace, regurgitant fraction <1% Pulmonary valve regurgitation: Trivial, regurgitant fraction 3% Mitral valve regurgitation: Mild, regurgitant fraction 7% Tricuspid valve regurgitation: Trivial, regurgitant fraction 5% Left ventricle: LV male LV EF: 55% (normal 49-79%) Absolute volumes: LV EDV: (Normal 95-215 mL) LV ESV: 85mL (Normal 25-85 mL) LV SV: (Normal 61-145 mL) CO: 6.0L/min (Normal 3.4-7.8 L/min) Indexed volumes: CI: 2.6L/min/sq-m (Normal 1.8-4.2 L/min/sq-m) LV EDV: 4mL/sq-m (Normal 50-108 mL/sq-m) LV ESV: 48mL/sq-m (Normal 11-47 mL/sq-m) LV SV: 42mL/sq-m (Normal 33-72 mL/sq-m) Right ventricle: RV male RV EF:  50% (Normal 51-80%) Absolute volumes: RV EDV: (Normal 109-217 mL) RV ESV: (Normal 23-91 mL) RV SV: (Normal 71-141 mL) CO: 5.9L/min (Normal 2.8-8.8 L/min) Indexed volumes: CI: 2.57L/min/sq-m (Normal 1.7-4.2 L/min/sq-m) RV EDV: 67mL/sq-m (Normal 58-109 mL/sq-m) RV ESV: 29mL/sq-m (Normal 12-46 mL/sq-m) RV SV: 78mL/sq-m (Normal 38-71 mL/sq-m) IMPRESSION: 1. Findings most consistent with myocarditis. LV basal-mid subepicardial inferior and inferolateral delayed enhancement, with mid-apical inferior wall dense subepicardial and midmyocardial LGE, extending to the inferoseptum and inferolateral walls. There is also dense focal near transmural area of LGE in the apical anteroseptum. Associated myocardial edema in the anteroseptal apex and inferoapex. 2. Trivial pericardial effusion. No definite delayed pericardial enhancement. 3. Normal biventricular chamber size and function. LVEF 55%. RVEF 50%. 4.  No hemodynamically significant valvular heart disease. Electronically Signed   By: Soyla Merck M.D.   On: 02/27/2024 09:10   ECHOCARDIOGRAM COMPLETE Result Date: 02/26/2024     ECHOCARDIOGRAM REPORT   Patient Name:   Scott Gardner Date of Exam: 02/26/2024 Medical Rec #:  [id number]            Height:       70.0 in Accession #:    [id number]           Weight:       237.7 lb Date of Birth:  July 08, 2001            BSA:  2.246 m Patient Age:    23 years             BP:           120/74 mmHg Patient Gender: M                    HR:           60 bpm. Exam Location:  Inpatient Procedure: 2D Echo, Color Doppler and Cardiac Doppler (Both Spectral and Color            Flow Doppler were utilized during procedure). Indications:    Elevated Troponins  History:        Patient has no prior history of Echocardiogram examinations.                 Risk Factors:ETOH.  Sonographer:    Damien Senior RDCS Referring Phys: [id number] DAYNA N DUNN IMPRESSIONS  1. Left ventricular ejection fraction, by estimation, is 50 to 55%. The left ventricle has low normal function. The left ventricle has no regional wall motion abnormalities. Left ventricular diastolic parameters were normal.  2. Right ventricular systolic function is normal. The right ventricular size is normal. Tricuspid regurgitation signal is inadequate for assessing PA pressure.  3. The mitral valve is normal in structure. Trivial mitral valve regurgitation. No evidence of mitral stenosis.  4. The aortic valve is tricuspid. Aortic valve regurgitation is not visualized. No aortic stenosis is present.  5. The inferior vena cava is normal in size with greater than 50% respiratory variability, suggesting right atrial pressure of 3 mmHg. FINDINGS  Left Ventricle: Left ventricular ejection fraction, by estimation, is 50 to 55%. The left ventricle has low normal function. The left ventricle has no regional wall motion abnormalities. The left ventricular internal cavity size was normal in size. There is no left ventricular hypertrophy. Left ventricular diastolic parameters were normal. Right Ventricle: The right ventricular size is normal. No increase in  right ventricular wall thickness. Right ventricular systolic function is normal. Tricuspid regurgitation signal is inadequate for assessing PA pressure. Left Atrium: Left atrial size was normal in size. Right Atrium: Right atrial size was normal in size. Pericardium: There is no evidence of pericardial effusion. Mitral Valve: The mitral valve is normal in structure. Trivial mitral valve regurgitation. No evidence of mitral valve stenosis. Tricuspid Valve: The tricuspid valve is normal in structure. Tricuspid valve regurgitation is trivial. No evidence of tricuspid stenosis. Aortic Valve: The aortic valve is tricuspid. Aortic valve regurgitation is not visualized. No aortic stenosis is present. Pulmonic Valve: The pulmonic valve was normal in structure. Pulmonic valve regurgitation is trivial. No evidence of pulmonic stenosis. Aorta: The aortic root is normal in size and structure. Venous: The inferior vena cava is normal in size with greater than 50% respiratory variability, suggesting right atrial pressure of 3 mmHg. IAS/Shunts: No atrial level shunt detected by color flow Doppler.  LEFT VENTRICLE PLAX 2D LVIDd:         4.70 cm   Diastology LVIDs:         3.30 cm   LV e' medial:    16.80 cm/s LV PW:         0.80 cm   LV E/e' medial:  6.2 LV IVS:        0.80 cm   LV e' lateral:   16.80 cm/s LVOT diam:     2.10 cm   LV E/e' lateral: 6.2 LV SV:  53 LV SV Index:   24 LVOT Area:     3.46 cm  RIGHT VENTRICLE RV S prime:     14.30 cm/s TAPSE (M-mode): 2.3 cm LEFT ATRIUM             Index        RIGHT ATRIUM           Index LA diam:        3.40 cm 1.51 cm/m   RA Area:     17.50 cm LA Vol (A2C):   52.9 ml 23.55 ml/m  RA Volume:   48.60 ml  21.64 ml/m LA Vol (A4C):   43.6 ml 19.41 ml/m LA Biplane Vol: 50.8 ml 22.62 ml/m  AORTIC VALVE LVOT Vmax:   84.60 cm/s LVOT Vmean:  58.600 cm/s LVOT VTI:    0.154 m  AORTA Ao Root diam: 2.80 cm Ao Asc diam:  2.80 cm MITRAL VALVE MV Area (PHT): 3.17 cm     SHUNTS MV Decel  Time: 239 msec     Systemic VTI:  0.15 m MV E velocity: 105.00 cm/s  Systemic Diam: 2.10 cm MV A velocity: 47.20 cm/s MV E/A ratio:  2.22 Soyla Merck MD Electronically signed by Soyla Merck MD Signature Date/Time: 02/26/2024/7:05:31 PM    Final    CARDIAC CATHETERIZATION Result Date: 02/26/2024 No angiographic evidence of CAD LVEDP 18 mmHg. Recommendations: No further ischemic workup.   CT Angio Chest Aorta W and/or Wo Contrast Result Date: 02/25/2024 CLINICAL DATA:  Acute aortic syndrome (AAS) suspected. Chest pain. Constant. EXAM: CT ANGIOGRAPHY CHEST WITH CONTRAST TECHNIQUE: Multidetector CT imaging of the chest was performed using the standard protocol during bolus administration of intravenous contrast. Multiplanar CT image reconstructions and MIPs were obtained to evaluate the vascular anatomy. RADIATION DOSE REDUCTION: This exam was performed according to the departmental dose-optimization program which includes automated exposure control, adjustment of the mA and/or kV according to patient size and/or use of iterative reconstruction technique. CONTRAST:  OMNIPAQUE  IOHEXOL  350 MG/ML SOLN COMPARISON:  None Available. FINDINGS: Cardiovascular: Evaluation is limited due to patient's motion during data acquisition. There is significant motion especially at the level of aortic arch/main pulmonary trunk region. Preferential opacification of the thoracic aorta. No evidence of thoracic aortic aneurysm, dissection, penetrating atherosclerotic ulcer or vasculitis. Normal heart size. No pericardial effusion. There is also satisfactory opacification of pulmonary arteries. No evidence of embolism to the proximal subsegmental pulmonary artery level. No aortic aneurysm. Mediastinum/Nodes: Visualized thyroid gland appears grossly unremarkable. No solid / cystic mediastinal masses. The esophagus is nondistended precluding optimal assessment. No axillary, mediastinal or hilar lymphadenopathy by size  criteria. Lungs/Pleura: The central tracheo-bronchial tree is patent. No mass or consolidation. No pleural effusion or pneumothorax. No suspicious lung nodules. Upper Abdomen: There is an approximately 1.3 x 1.5 cm simple cyst in the left kidney upper pole, posteriorly. Remaining visualized upper abdominal viscera within normal limits. Musculoskeletal: The visualized soft tissues of the chest wall are grossly unremarkable. No suspicious osseous lesions. Review of the MIP images confirms the above findings. IMPRESSION: 1. Evaluation is limited due to patient's motion during data acquisition. No evidence of thoracic aortic aneurysm, dissection or vasculitis. No evidence of pulmonary embolism to the proximal subsegmental pulmonary artery level. 2. No lung mass, consolidation, pleural effusion or pneumothorax. 3. Essentially unremarkable exam, as described above. Electronically Signed   By: Ree Molt M.D.   On: 02/25/2024 14:37   DG Chest 2 View Result Date: 02/25/2024 CLINICAL DATA:  Chest pain. EXAM: CHEST - 2 VIEW COMPARISON:  Chest radiograph dated 12/25/2015. FINDINGS: No focal consolidation, pleural effusion, pneumothorax. The cardiac silhouette is within normal limits. No acute osseous pathology. IMPRESSION: No active cardiopulmonary disease. Electronically Signed   By: Vanetta Chou M.D.   On: 02/25/2024 12:11   DG Knee Complete 4 Views Right Result Date: 02/23/2024 CLINICAL DATA:  Injury skip EXAM: RIGHT KNEE - COMPLETE 4+ VIEW COMPARISON:  None Available. FINDINGS: No acute fracture or dislocation identified. Joint effusion present. No evidence of arthropathy or other focal bone abnormality. There is anterior and lateral knee soft tissue swelling. There is also soft tissue thickening in the region of the patellar tendon. IMPRESSION: 1. No acute fracture or dislocation. 2. Joint effusion. 3. Anterior and lateral knee soft tissue swelling. Electronically Signed   By: Greig Pique M.D.   On:  02/23/2024 16:34      DISPOSITION: Home  Discharge Instructions     Call MD for:  difficulty breathing, headache or visual disturbances   Complete by: As directed    Call MD for:  extreme fatigue   Complete by: As directed    Call MD for:  persistant dizziness or light-headedness   Complete by: As directed    Call MD for:  persistant nausea and vomiting   Complete by: As directed    Call MD for:  severe uncontrolled pain   Complete by: As directed    Call MD for:  temperature >100.4   Complete by: As directed    Diet general   Complete by: As directed    Discharge instructions   Complete by: As directed    Cardiology will arrange follow-up.  Take your medications as prescribed.  Avoid alcohol use.  You were cared for by a hospitalist during your hospital stay. If you have any questions about your discharge medications or the care you received while you were in the hospital after you are discharged, you can call the unit and asked to speak with the hospitalist on call if the hospitalist that took care of you is not available. Once you are discharged, your primary care physician will handle any further medical issues. Please note that NO REFILLS for any discharge medications will be authorized once you are discharged, as it is imperative that you return to your primary care physician (or establish a relationship with a primary care physician if you do not have one) for your aftercare needs so that they can reassess your need for medications and monitor your lab values. If you do not have a primary care physician, you can call [phone] for a physician referral.   Increase activity slowly   Complete by: As directed         Allergies as of 02/27/2024   No Known Allergies      Medication List     TAKE these medications    colchicine  0.6 MG tablet Take 1 tablet (0.6 mg total) by mouth 2 (two) times daily.   folic acid  1 MG tablet Commonly known as: FOLVITE  Take 1 tablet (1 mg  total) by mouth daily.   ibuprofen  600 MG tablet Commonly known as: ADVIL  Take 1 tablet (600 mg total) by mouth 3 (three) times daily. What changed:  medication strength how much to take when to take this reasons to take this additional instructions   pantoprazole  40 MG tablet Commonly known as: PROTONIX  Take 1 tablet (40 mg total) by mouth daily.   thiamine  100 MG  tablet Commonly known as: Vitamin B-1 Take 1 tablet (100 mg total) by mouth daily.          Follow-up Information     [facility] - A Dept Of [facility]. Go on 05/05/2024.   Specialty: Internal Medicine Why: @3 :00pm Contact information: [address] [place]  [address] [phone] Additional information: [address] [place], [place] [address]                TOTAL DISCHARGE TIME: 35 mins  Lavell Supple Verdene  [employer] designer on [email or web address]  02/28/2024, 10:46 AM

## 2024-02-27 NOTE — TOC Transition Note (Signed)
 Transition of Care [facility]) - Discharge Note   Patient Details  Name: Scott Gardner MRN: [id number] Date of Birth: 09-16-2000  Transition of [facility]) CM/SW Contact:  Waddell Barnie Rama, RN Phone Number: 02/27/2024, 11:23 AM   Clinical Narrative:    For dc home today, he has no needs          Patient Goals and CMS Choice            Discharge Placement                       Discharge Plan and Services Additional resources added to the After Visit Summary for                                       Social Drivers of Health (SDOH) Interventions SDOH Screenings   Food Insecurity: No Food Insecurity (02/25/2024)  Housing: Low Risk  (02/25/2024)  Transportation Needs: No Transportation Needs (02/25/2024)  Utilities: Not At Risk (02/25/2024)  Social Connections: Unknown (02/25/2024)  Tobacco Use: High Risk (02/25/2024)     Readmission Risk Interventions     No data to display

## 2024-03-08 NOTE — Progress Notes (Signed)
 Cardiology Office Note   Date:  03/22/2024  ID:  MAXX PHAM, DOB Dec 11, 2000, MRN [id number] PCP: Freddrick Johns  [facility] Providers Cardiologist:  Soyla DELENA Merck, MD     History of Present Illness HERRICK HARTOG is a 23 y.o. male with a past medical history of ETOH abuse, tobacco use, recent myocarditis. Patient presents today for a hospital follow up appointment   Patient presented to the ED on 7/30 complaining of chest pain. Underwent echocardiogram 7/31 that showed EF 50-55%, no regional wall motion abnormalities, normal RV systolic function. Cardiac catheterization on 7/31 showed no angiographic evidence of CAD. Cardiac MRI in 02/2024 was consistent with myocarditis. Started on ibuprofen  600 mg TID, colchicine  0.6 mg BID, protonix  40 mg daily   Today, patient reports that he has been doing well since getting out of the hospital.  He denies chest pain, palpitations, dizziness, syncope, near syncope.  He had an upper respiratory infection last week with nasal congestion and cough.  Had a bit of shortness of breath during this but as he has recovered his breathing has returned to normal.  He has been on colchicine  and ibuprofen .  Instructed patient to stop ibuprofen  as it has been over 10 days.  Patient has been careful to avoid strenuous activity and has not been drinking very much alcohol.  Reports that he will occasionally have 1 drink.  Discussed that it is important to only drink alcohol and caffeine in moderation and to avoid binge/heavy drinking.  Instructed patient to continue to avoid strenuous activity for at least 3 months, then can slowly start to increase physical activity as tolerated. \ Studies Reviewed  Cardiac Studies & Procedures   ______________________________________________________________________________________________ CARDIAC CATHETERIZATION  CARDIAC CATHETERIZATION 02/26/2024  Conclusion No angiographic evidence of CAD LVEDP 18  mmHg.  Recommendations: No further ischemic workup.  Findings Coronary Findings Diagnostic  Dominance: Right  Left Anterior Descending Vessel is large.  Left Circumflex Vessel is large.  Right Coronary Artery Vessel is large.  Intervention  No interventions have been documented.     ECHOCARDIOGRAM  ECHOCARDIOGRAM COMPLETE 02/26/2024  Narrative ECHOCARDIOGRAM REPORT    Patient Name:   ASHELY JOSHUA Date of Exam: 02/26/2024 Medical Rec #:  [id number]            Height:       70.0 in Accession #:    [id number]           Weight:       237.7 lb Date of Birth:  [birth year]            BSA:          2.246 m Patient Age:    23 years             BP:           120/74 mmHg Patient Gender: M                    HR:           60 bpm. Exam Location:  Inpatient  Procedure: 2D Echo, Color Doppler and Cardiac Doppler (Both Spectral and Color Flow Doppler were utilized during procedure).  Indications:    Elevated Troponins  History:        Patient has no prior history of Echocardiogram examinations. Risk Factors:ETOH.  Sonographer:    Damien Senior RDCS Referring Phys: [phone] DAYNA N DUNN  IMPRESSIONS   1. Left ventricular ejection fraction, by estimation, is 50  to 55%. The left ventricle has low normal function. The left ventricle has no regional wall motion abnormalities. Left ventricular diastolic parameters were normal. 2. Right ventricular systolic function is normal. The right ventricular size is normal. Tricuspid regurgitation signal is inadequate for assessing PA pressure. 3. The mitral valve is normal in structure. Trivial mitral valve regurgitation. No evidence of mitral stenosis. 4. The aortic valve is tricuspid. Aortic valve regurgitation is not visualized. No aortic stenosis is present. 5. The inferior vena cava is normal in size with greater than 50% respiratory variability, suggesting right atrial pressure of 3 mmHg.  FINDINGS Left Ventricle: Left ventricular  ejection fraction, by estimation, is 50 to 55%. The left ventricle has low normal function. The left ventricle has no regional wall motion abnormalities. The left ventricular internal cavity size was normal in size. There is no left ventricular hypertrophy. Left ventricular diastolic parameters were normal.  Right Ventricle: The right ventricular size is normal. No increase in right ventricular wall thickness. Right ventricular systolic function is normal. Tricuspid regurgitation signal is inadequate for assessing PA pressure.  Left Atrium: Left atrial size was normal in size.  Right Atrium: Right atrial size was normal in size.  Pericardium: There is no evidence of pericardial effusion.  Mitral Valve: The mitral valve is normal in structure. Trivial mitral valve regurgitation. No evidence of mitral valve stenosis.  Tricuspid Valve: The tricuspid valve is normal in structure. Tricuspid valve regurgitation is trivial. No evidence of tricuspid stenosis.  Aortic Valve: The aortic valve is tricuspid. Aortic valve regurgitation is not visualized. No aortic stenosis is present.  Pulmonic Valve: The pulmonic valve was normal in structure. Pulmonic valve regurgitation is trivial. No evidence of pulmonic stenosis.  Aorta: The aortic root is normal in size and structure.  Venous: The inferior vena cava is normal in size with greater than 50% respiratory variability, suggesting right atrial pressure of 3 mmHg.  IAS/Shunts: No atrial level shunt detected by color flow Doppler.   LEFT VENTRICLE PLAX 2D LVIDd:         4.70 cm   Diastology LVIDs:         3.30 cm   LV e' medial:    16.80 cm/s LV PW:         0.80 cm   LV E/e' medial:  6.2 LV IVS:        0.80 cm   LV e' lateral:   16.80 cm/s LVOT diam:     2.10 cm   LV E/e' lateral: 6.2 LV SV:         53 LV SV Index:   24 LVOT Area:     3.46 cm   RIGHT VENTRICLE RV S prime:     14.30 cm/s TAPSE (M-mode): 2.3 cm  LEFT ATRIUM             Index         RIGHT ATRIUM           Index LA diam:        3.40 cm 1.51 cm/m   RA Area:     17.50 cm LA Vol (A2C):   52.9 ml 23.55 ml/m  RA Volume:   48.60 ml  21.64 ml/m LA Vol (A4C):   43.6 ml 19.41 ml/m LA Biplane Vol: 50.8 ml 22.62 ml/m AORTIC VALVE LVOT Vmax:   84.60 cm/s LVOT Vmean:  58.600 cm/s LVOT VTI:    0.154 m  AORTA Ao Root diam: 2.80 cm Ao Asc diam:  2.80 cm  MITRAL VALVE MV Area (PHT): 3.17 cm     SHUNTS MV Decel Time: 239 msec     Systemic VTI:  0.15 m MV E velocity: 105.00 cm/s  Systemic Diam: 2.10 cm MV A velocity: 47.20 cm/s MV E/A ratio:  2.22  Soyla Merck MD Electronically signed by Soyla Merck MD Signature Date/Time: 02/26/2024/7:05:31 PM    Final        CARDIAC MRI  MR CARDIAC MORPHOLOGY W WO CONTRAST 02/27/2024  Narrative CLINICAL DATA:  Myocarditis suspected  COMPARISON: Echo 02/26/24  EXAM: MR CARDIA MORPHOLOGY WITHOUT AND WITH CONTRAST; MR CARDIAC VELOCITY FLOW MAPPING  TECHNIQUE: The patient was scanned on a 1.5 Tesla Siemens magnet. A dedicated cardiac coil was used. Functional imaging was done using TrueFisp sequences. 2,3, and 4 chamber views were done to assess for RWMA's. Modified Simpson's rule using a short axis stack was used to calculate an ejection fraction on a dedicated work [employer]. The patient received 10mL GADAVIST  GADOBUTROL  1 MMOL/ML IV SOLN. After 10 minutes inversion recovery sequences were used to assess for infiltration and scar tissue. Phase contrast velocity encoded images obtained x 2.  This examination is tailored for evaluation cardiac anatomy and function and provides very limited assessment of noncardiac structures, which are accordingly not evaluated during interpretation. If there is clinical concern for extracardiac pathology, further evaluation with CT imaging should be considered.  FINDINGS: LEFT VENTRICLE:  Left ventricular chamber size: Normal.  Left ventricular  wall thickness: Grossly normal.  Left ventricular systolic function: Normal  LVEF = 55%  There are no regional wall motion abnormalities.  Myocardial edema in the apical anteroseptum, T2 globally = 48 msec, however T2 time elevated in the inferoapex, T2 66 ms  Normal first pass perfusion.  There is post contrast delayed myocardial enhancement: LV basal-mid subepicardial inferior and inferolateral LGE. At the mid to apical inferior wall, LGE is dense subepicardial and midmyocardial, extending to the inferoseptum and inferolateral walls. There is also dense focal area of LGE in the apical anteroseptum, near transmural subepicardial-midmyocardial.  Normal T1 myocardial nulling kinetics suggest against a diagnosis of cardiac amyloidosis.  ECV = 26%, normal range  RIGHT VENTRICLE:  Normal right ventricular chamber size.  Normal right ventricular wall thickness.  Normal right ventricular systolic function.  RVEF = 50%  There are no regional wall motion abnormalities.  No post contrast delayed myocardial enhancement.  ATRIA:  Normal  PERICARDIUM:  Normal pericardium. Trivial pericardial effusion. No definite pericardial delayed enhancement.  OTHER: No significant extracardiac findings.  MEASUREMENTS: Qp/Qs: 0.97  VALVES:  Aortic valve regurgitation: Trace, regurgitant fraction <1%  Pulmonary valve regurgitation: Trivial, regurgitant fraction 3%  Mitral valve regurgitation: Mild, regurgitant fraction 7%  Tricuspid valve regurgitation: Trivial, regurgitant fraction 5%  Left ventricle:  LV male  LV EF: 55% (normal 49-79%)  Absolute volumes:  LV EDV: (Normal 95-215 mL)  LV ESV: 85mL (Normal 25-85 mL)  LV SV: (Normal 61-145 mL)  CO: 6.0L/min (Normal 3.4-7.8 L/min)  Indexed volumes:  CI: 2.6L/min/sq-m (Normal 1.8-4.2 L/min/sq-m)  LV EDV: 37mL/sq-m (Normal 50-108 mL/sq-m)  LV ESV: 11mL/sq-m (Normal 11-47 mL/sq-m)  LV SV: 72mL/sq-m  (Normal 33-72 mL/sq-m)  Right ventricle:  RV male  RV EF:  50% (Normal 51-80%)  Absolute volumes:  RV EDV: (Normal 109-217 mL)  RV ESV: (Normal 23-91 mL)  RV SV: (Normal 71-141 mL)  CO: 5.9L/min (Normal 2.8-8.8 L/min)  Indexed volumes:  CI: 2.57L/min/sq-m (Normal 1.7-4.2  L/min/sq-m)  RV EDV: 32mL/sq-m (Normal 58-109 mL/sq-m)  RV ESV: 54mL/sq-m (Normal 12-46 mL/sq-m)  RV SV: 40mL/sq-m (Normal 38-71 mL/sq-m)  IMPRESSION: 1. Findings most consistent with myocarditis. LV basal-mid subepicardial inferior and inferolateral delayed enhancement, with mid-apical inferior wall dense subepicardial and midmyocardial LGE, extending to the inferoseptum and inferolateral walls. There is also dense focal near transmural area of LGE in the apical anteroseptum. Associated myocardial edema in the anteroseptal apex and inferoapex.  2. Trivial pericardial effusion. No definite delayed pericardial enhancement.  3. Normal biventricular chamber size and function. LVEF 55%. RVEF 50%.  4.  No hemodynamically significant valvular heart disease.   Electronically Signed By: Soyla Merck M.D. On: 02/27/2024 09:10   ______________________________________________________________________________________________       Risk Assessment/Calculations               Physical Exam VS:  BP 120/60 (BP Location: Left Arm, Patient Position: Sitting, Cuff Size: Large)   Pulse 83   Ht 5' 10 (1.778 m)   Wt 239 lb (108.4 kg)   SpO2 96%   BMI 34.29 kg/m        Wt Readings from Last 3 Encounters:  03/22/24 239 lb (108.4 kg)  02/27/24 236 lb 5.3 oz (107.2 kg)  06/26/20 230 lb (104.3 kg) (98%, Z= 2.13)*   * Growth percentiles are based on CDC (Boys, 2-20 Years) data.    GEN: Well nourished, well developed in no acute distress. Sitting comfortably on the exam table  NECK: No JVD  CARDIAC:  RRR, no murmurs, rubs, gallops. Radial pulses 2+ bilaterally  RESPIRATORY:   Clear to auscultation without rales, wheezing or rhonchi. 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 EF 50-55%. Suspected to be alcohol vs viral etiology  -Patient reports he has been doing well since getting discharged from the hospital.  He denies chest pain.  He has been limiting his physical activity and his alcohol intake. -EKG today shows normal sinus rhythm with sinus arrhythmia.  Continues to have T wave inversion in the inferior leads - Patient has been on ibuprofen  since being discharged from the hospital on 8/1.  Stop ibuprofen  as he has completed more than his 10 day course  - Continue colchicine  0.6 mg BID for total of 3 months  - Ordered BMP for medication monitoring -Ordered limited echo to be completed in 2 months -Instructed patient to avoid strenuous activity for the next 2 months (3 months total since his diagnosis).  Then, he may slowly begin to exercise.  Instructed patient to limit alcohol use   Dispo: Follow up in 3 months with Me   Signed, Rollo FABIENE Louder, PA-C

## 2024-03-19 ENCOUNTER — Inpatient Hospital Stay: Payer: Self-pay | Admitting: Nurse Practitioner

## 2024-03-22 ENCOUNTER — Encounter: Payer: Self-pay | Admitting: Cardiology

## 2024-03-22 ENCOUNTER — Ambulatory Visit: Attending: Cardiology | Admitting: Cardiology

## 2024-03-22 VITALS — BP 120/60 | HR 83 | Ht 70.0 in | Wt 239.0 lb

## 2024-03-22 DIAGNOSIS — R079 Chest pain, unspecified: Secondary | ICD-10-CM | POA: Insufficient documentation

## 2024-03-22 DIAGNOSIS — B3322 Viral myocarditis: Secondary | ICD-10-CM | POA: Diagnosis not present

## 2024-03-22 NOTE — Patient Instructions (Signed)
 Medication Instructions:  Stop Ibuprofen  *If you need a refill on your cardiac medications before your next appointment, please call your pharmacy*  Lab Work: Today we are going to draw a Bmet If you have labs (blood work) drawn today and your tests are completely normal, you will receive your results only by: MyChart Message (if you have MyChart) OR A paper copy in the mail If you have any lab test that is abnormal or we need to change your treatment, we will call you to review the results.  Testing/Procedures: In 2 month we are going to need a limited echocardiogram scheduled Your physician has requested that you have a limited echocardiogram. Echocardiography is a painless test that uses sound waves to create images of your heart. It provides your doctor with information about the size and shape of your heart and how well your heart's chambers and valves are working. This procedure takes approximately one hour. There are no restrictions for this procedure. Please do NOT wear cologne, perfume, aftershave, or lotions (deodorant is allowed). Please arrive 15 minutes prior to your appointment time.  Please note: We ask at that you not bring children with you during ultrasound (echo/ vascular) testing. Due to room size and safety concerns, children are not allowed in the ultrasound rooms during exams. Our front office staff cannot provide observation of children in our lobby area while testing is being conducted. An adult accompanying a patient to their appointment will only be allowed in the ultrasound room at the discretion of the ultrasound technician under special circumstances. We apologize for any inconvenience.  Follow-Up: At [facility], you and your health needs are our priority.  As part of our continuing mission to provide you with exceptional heart care, our providers are all part of one team.  This team includes your primary Cardiologist (physician) and Advanced Practice  Providers or APPs (Physician Assistants and Nurse Practitioners) who all work together to provide you with the care you need, when you need it.  Your next appointment:   3 month(s)  Provider:   Rollo Louder, PA-C

## 2024-03-31 ENCOUNTER — Encounter (HOSPITAL_COMMUNITY): Payer: Self-pay | Admitting: Cardiology

## 2024-05-05 ENCOUNTER — Ambulatory Visit: Payer: Self-pay | Admitting: Nurse Practitioner

## 2024-07-12 ENCOUNTER — Emergency Department (HOSPITAL_BASED_OUTPATIENT_CLINIC_OR_DEPARTMENT_OTHER)
Admission: EM | Admit: 2024-07-12 | Discharge: 2024-07-12 | Disposition: A | Discharge status: Home / Self Care | Attending: Emergency Medicine | Admitting: Emergency Medicine

## 2024-07-12 ENCOUNTER — Other Ambulatory Visit: Payer: Self-pay

## 2024-07-12 ENCOUNTER — Encounter (HOSPITAL_BASED_OUTPATIENT_CLINIC_OR_DEPARTMENT_OTHER): Payer: Self-pay | Admitting: *Deleted

## 2024-07-12 DIAGNOSIS — M62838 Other muscle spasm: Secondary | ICD-10-CM | POA: Diagnosis not present

## 2024-07-12 DIAGNOSIS — Z720 Tobacco use: Secondary | ICD-10-CM | POA: Diagnosis not present

## 2024-07-12 DIAGNOSIS — M542 Cervicalgia: Secondary | ICD-10-CM | POA: Diagnosis present

## 2024-07-12 MED ORDER — KETOROLAC TROMETHAMINE 30 MG/ML IJ SOLN
30.0000 mg | Freq: Once | INTRAMUSCULAR | Status: AC
  Administered 2024-07-12: 06:00:00 30 mg via INTRAMUSCULAR
  Filled 2024-07-12: qty 1

## 2024-07-12 MED ORDER — IBUPROFEN 600 MG PO TABS: 600.0000 mg | ORAL_TABLET | Freq: Four times a day (QID) | ORAL | 0 refills | Status: AC | PRN

## 2024-07-12 MED ORDER — CYCLOBENZAPRINE HCL 5 MG PO TABS
5.0000 mg | ORAL_TABLET | Freq: Once | ORAL | Status: AC
  Administered 2024-07-12: 06:00:00 5 mg via ORAL
  Filled 2024-07-12: qty 1

## 2024-07-12 MED ORDER — CYCLOBENZAPRINE HCL 5 MG PO TABS: 5.0000 mg | ORAL_TABLET | Freq: Two times a day (BID) | ORAL | 0 refills | Status: AC | PRN

## 2024-07-12 NOTE — Discharge Instructions (Signed)
 You were seen today for muscle soreness.  This may be related to the recent weight lifting.  Make sure that you are stretching appropriately.  Take medications as prescribed.  You should take ibuprofen  for 3 to 5 days.  Do not drive while taking Flexeril .

## 2024-07-12 NOTE — ED Provider Notes (Signed)
 [place] EMERGENCY DEPARTMENT AT [facility] Provider Note   CSN: [id number] Arrival date & time: 07/12/24  9474     Patient presents with: Neck Pain   Scott Gardner is a 23 y.o. male.   HPI     This is a 23 year old male who presents with concern for neck and shoulder pain.  Reports he felt some work started yesterday.  He states that it worsened throughout the day and he had difficulty sleeping last night.  He has limited range of motion of the neck.  Did recently start lifting weights approximately 1 week ago.  He took a dose of ibuprofen  with minimal relief.  Denies numbness or tingling of the arm or hand.  Prior to Admission medications  Medication Sig Start Date End Date Taking? Authorizing Provider  cyclobenzaprine  (FLEXERIL ) 5 MG tablet Take 1 tablet (5 mg total) by mouth 2 (two) times daily as needed for muscle spasms. 07/12/24  Yes Hitoshi Werts, Charmaine FALCON, MD  ibuprofen  (ADVIL ) 600 MG tablet Take 1 tablet (600 mg total) by mouth every 6 (six) hours as needed. 07/12/24  Yes Ajwa Kimberley, Charmaine FALCON, MD  colchicine  0.6 MG tablet Take 1 tablet (0.6 mg total) by mouth 2 (two) times daily. 02/27/24   Verdene Purchase, MD  folic acid  (FOLVITE ) 1 MG tablet Take 1 tablet (1 mg total) by mouth daily. 02/28/24   Krishnan, Gokul, MD  pantoprazole  (PROTONIX ) 40 MG tablet Take 1 tablet (40 mg total) by mouth daily. 02/28/24   Krishnan, Gokul, MD  thiamine  (VITAMIN B-1) 100 MG tablet Take 1 tablet (100 mg total) by mouth daily. 02/28/24   Krishnan, Gokul, MD  thiamine  (VITAMIN B1) 100 MG tablet Take 100 mg by mouth daily. 02/27/24   [provider]    Allergies: Patient has no known allergies.    Review of Systems  Constitutional:  Negative for fever.  Respiratory:  Negative for shortness of breath.   Cardiovascular:  Negative for chest pain.  Musculoskeletal:  Positive for neck pain and neck stiffness.  All other systems reviewed and are negative.   Updated Vital Signs BP  114/80   Pulse 67   Temp 97.6 F (36.4 C)   Resp 18   SpO2 99%   Physical Exam Vitals and nursing note reviewed.  Constitutional:      Appearance: He is well-developed. He is obese. He is not ill-appearing.  HENT:     Head: Normocephalic and atraumatic.  Eyes:     Pupils: Pupils are equal, round, and reactive to light.  Neck:     Comments: Limited axial rotation with more degrees of rotation to the left into the right, tenderness palpation right trapezius Cardiovascular:     Rate and Rhythm: Normal rate and regular rhythm.     Heart sounds: Normal heart sounds. No murmur heard. Pulmonary:     Effort: Pulmonary effort is normal. No respiratory distress.  Abdominal:     Palpations: Abdomen is soft.  Musculoskeletal:     Cervical back: Neck supple.     Comments: Normal range of motion of the right shoulder, strength intact, neurovascular intact distally  Lymphadenopathy:     Cervical: No cervical adenopathy.  Skin:    General: Skin is warm and dry.  Neurological:     Mental Status: He is alert and oriented to person, place, and time.     (all labs ordered are listed, but only abnormal results are displayed) Labs Reviewed - No data to display  EKG: None  Radiology: No results found.   Procedures   Medications Ordered in the ED  cyclobenzaprine  (FLEXERIL ) tablet 5 mg (has no administration in time range)  ketorolac  (TORADOL ) 30 MG/ML injection 30 mg (has no administration in time range)                                    Medical Decision Making Risk Prescription drug management.   This patient presents to the ED for concern of acute shoulder pain, this involves an extensive number of treatment options, and is a complaint that carries with it a high risk of complications and morbidity.  I considered the following differential and admission for this acute, potentially life threatening condition.  The differential diagnosis includes injury, spasm, hypertrophy,  radiculopathy  MDM:    This is a 23 year old male who presents with concern for neck and shoulder pain.  He is nontoxic and vital signs are reassuring.  He has some limited axial range of motion of the neck and pain of the right trapezius, suspect muscle spasm.  He has good strength and no concerns for weakness.  Will trial anti-inflammatories and a muscle relaxant.  Recommend rest and stretching.  (Labs, imaging, consults)  Labs: I Ordered, and personally interpreted labs.  The pertinent results include: N/A  Imaging Studies ordered: I ordered imaging studies including N/A I independently visualized and interpreted imaging. I agree with the radiologist interpretation  Additional history obtained from chart review.  External records from outside source obtained and reviewed including prior evaluations  Cardiac Monitoring: The patient was not maintained on a cardiac monitor.  If on the cardiac monitor, I personally viewed and interpreted the cardiac monitored which showed an underlying rhythm of: N/A  Reevaluation: After the interventions noted above, I reevaluated the patient and found that they have :stayed the same  Social Determinants of Health:  lives independently  Disposition: Discharge  Co morbidities that complicate the patient evaluation  Past Medical History:  Diagnosis Date   Tobacco use      Medicines Meds ordered this encounter  Medications   cyclobenzaprine  (FLEXERIL ) tablet 5 mg   ketorolac  (TORADOL ) 30 MG/ML injection 30 mg   ibuprofen  (ADVIL ) 600 MG tablet    Sig: Take 1 tablet (600 mg total) by mouth every 6 (six) hours as needed.    Dispense:  30 tablet    Refill:  0   cyclobenzaprine  (FLEXERIL ) 5 MG tablet    Sig: Take 1 tablet (5 mg total) by mouth 2 (two) times daily as needed for muscle spasms.    Dispense:  10 tablet    Refill:  0    I have reviewed the patients home medicines and have made adjustments as needed  Problem List / ED  Course: Problem List Items Addressed This Visit   None Visit Diagnoses       Muscle spasm    -  Primary                Final diagnoses:  Muscle spasm    ED Discharge Orders          Ordered    ibuprofen  (ADVIL ) 600 MG tablet  Every 6 hours PRN        07/12/24 0618    cyclobenzaprine  (FLEXERIL ) 5 MG tablet  2 times daily PRN        07/12/24 0618  Bari Charmaine FALCON, MD 07/12/24 [phone]

## 2024-07-12 NOTE — ED Triage Notes (Signed)
 Pt arrives ambulatory with c/o right sided neck pain that goes through his right shoulder. Pain started yesterday and has become worse. Pain worse with lifting his arm and turning his head. Taking ibuprofen  for pain without relief. Recently started lifting weights.
